# Patient Record
Sex: Female | Born: 1955
Health system: Southern US, Community
[De-identification: ages and names within clinical notes are randomized; demographics above are authoritative.]

## PROBLEM LIST (undated history)

## (undated) DIAGNOSIS — E43 Unspecified severe protein-calorie malnutrition: Secondary | ICD-10-CM

## (undated) DIAGNOSIS — I639 Cerebral infarction, unspecified: Secondary | ICD-10-CM

## (undated) DIAGNOSIS — I4892 Unspecified atrial flutter: Secondary | ICD-10-CM

## (undated) DIAGNOSIS — Z8541 Personal history of malignant neoplasm of cervix uteri: Secondary | ICD-10-CM

## (undated) DIAGNOSIS — M1990 Unspecified osteoarthritis, unspecified site: Secondary | ICD-10-CM

## (undated) DIAGNOSIS — H814 Vertigo of central origin: Secondary | ICD-10-CM

## (undated) DIAGNOSIS — I160 Hypertensive urgency: Secondary | ICD-10-CM

## (undated) DIAGNOSIS — J449 Chronic obstructive pulmonary disease, unspecified: Secondary | ICD-10-CM

## (undated) DIAGNOSIS — F17200 Nicotine dependence, unspecified, uncomplicated: Secondary | ICD-10-CM

## (undated) DIAGNOSIS — I4891 Unspecified atrial fibrillation: Secondary | ICD-10-CM

## (undated) DIAGNOSIS — I10 Essential (primary) hypertension: Secondary | ICD-10-CM

## (undated) DIAGNOSIS — K219 Gastro-esophageal reflux disease without esophagitis: Secondary | ICD-10-CM

## (undated) DIAGNOSIS — M4156 Other secondary scoliosis, lumbar region: Secondary | ICD-10-CM

## (undated) DIAGNOSIS — Z720 Tobacco use: Secondary | ICD-10-CM

## (undated) DIAGNOSIS — F419 Anxiety disorder, unspecified: Secondary | ICD-10-CM

## (undated) DIAGNOSIS — M5412 Radiculopathy, cervical region: Secondary | ICD-10-CM

## (undated) DIAGNOSIS — R42 Dizziness and giddiness: Secondary | ICD-10-CM

## (undated) DIAGNOSIS — U071 COVID-19: Secondary | ICD-10-CM

## (undated) HISTORY — DX: Personal history of malignant neoplasm of cervix uteri: Z85.41

## (undated) HISTORY — DX: Dizziness and giddiness: R42

## (undated) HISTORY — DX: Radiculopathy, cervical region: M54.12

## (undated) HISTORY — DX: COVID-19: U07.1

## (undated) HISTORY — DX: Unspecified atrial flutter: I48.92

## (undated) HISTORY — DX: Unspecified severe protein-calorie malnutrition: E43

## (undated) HISTORY — DX: Anxiety disorder, unspecified: F41.9

## (undated) HISTORY — DX: Nicotine dependence, unspecified, uncomplicated: F17.200

## (undated) HISTORY — DX: Essential (primary) hypertension: I10

## (undated) HISTORY — DX: Gastro-esophageal reflux disease without esophagitis: K21.9

## (undated) HISTORY — DX: Chronic obstructive pulmonary disease, unspecified: J44.9

## (undated) HISTORY — DX: Hypertensive urgency: I16.0

## (undated) HISTORY — DX: Tobacco use: Z72.0

## (undated) HISTORY — DX: Cerebral infarction, unspecified: I63.9

## (undated) HISTORY — DX: Vertigo of central origin: H81.4

## (undated) HISTORY — DX: Unspecified atrial fibrillation: I48.91

## (undated) HISTORY — PX: TUBAL LIGATION: SHX77

## (undated) HISTORY — DX: Other secondary scoliosis, lumbar region: M41.56

## (undated) HISTORY — DX: Unspecified osteoarthritis, unspecified site: M19.90

---

## 2016-09-01 DIAGNOSIS — I10 Essential (primary) hypertension: Secondary | ICD-10-CM | POA: Diagnosis not present

## 2016-09-01 DIAGNOSIS — Z23 Encounter for immunization: Secondary | ICD-10-CM | POA: Diagnosis not present

## 2016-11-05 DIAGNOSIS — J209 Acute bronchitis, unspecified: Secondary | ICD-10-CM | POA: Diagnosis not present

## 2016-11-05 DIAGNOSIS — R05 Cough: Secondary | ICD-10-CM | POA: Diagnosis not present

## 2017-05-02 DIAGNOSIS — R0789 Other chest pain: Secondary | ICD-10-CM | POA: Diagnosis not present

## 2017-05-02 DIAGNOSIS — Z130 Encounter for screening for diseases of the blood and blood-forming organs and certain disorders involving the immune mechanism: Secondary | ICD-10-CM | POA: Diagnosis not present

## 2017-05-02 DIAGNOSIS — M25511 Pain in right shoulder: Secondary | ICD-10-CM | POA: Diagnosis not present

## 2017-05-02 DIAGNOSIS — Z719 Counseling, unspecified: Secondary | ICD-10-CM | POA: Diagnosis not present

## 2017-05-02 DIAGNOSIS — Z716 Tobacco abuse counseling: Secondary | ICD-10-CM | POA: Diagnosis not present

## 2017-05-02 DIAGNOSIS — R5383 Other fatigue: Secondary | ICD-10-CM | POA: Diagnosis not present

## 2017-05-02 DIAGNOSIS — I10 Essential (primary) hypertension: Secondary | ICD-10-CM | POA: Diagnosis not present

## 2017-05-04 DIAGNOSIS — M5136 Other intervertebral disc degeneration, lumbar region: Secondary | ICD-10-CM | POA: Diagnosis not present

## 2017-05-04 DIAGNOSIS — M546 Pain in thoracic spine: Secondary | ICD-10-CM | POA: Diagnosis not present

## 2017-05-10 DIAGNOSIS — M5412 Radiculopathy, cervical region: Secondary | ICD-10-CM | POA: Diagnosis not present

## 2017-05-10 DIAGNOSIS — M4156 Other secondary scoliosis, lumbar region: Secondary | ICD-10-CM | POA: Diagnosis not present

## 2017-07-25 DIAGNOSIS — Z1231 Encounter for screening mammogram for malignant neoplasm of breast: Secondary | ICD-10-CM | POA: Diagnosis not present

## 2017-08-30 DIAGNOSIS — Z01419 Encounter for gynecological examination (general) (routine) without abnormal findings: Secondary | ICD-10-CM | POA: Diagnosis not present

## 2017-08-30 DIAGNOSIS — Z23 Encounter for immunization: Secondary | ICD-10-CM | POA: Diagnosis not present

## 2017-08-30 DIAGNOSIS — R87615 Unsatisfactory cytologic smear of cervix: Secondary | ICD-10-CM | POA: Diagnosis not present

## 2017-08-30 DIAGNOSIS — H6123 Impacted cerumen, bilateral: Secondary | ICD-10-CM | POA: Diagnosis not present

## 2017-08-30 DIAGNOSIS — Z0001 Encounter for general adult medical examination with abnormal findings: Secondary | ICD-10-CM | POA: Diagnosis not present

## 2017-09-28 ENCOUNTER — Other Ambulatory Visit: Payer: Self-pay

## 2017-09-28 DIAGNOSIS — R0989 Other specified symptoms and signs involving the circulatory and respiratory systems: Secondary | ICD-10-CM

## 2017-09-29 ENCOUNTER — Encounter: Payer: Self-pay | Admitting: Surgery

## 2017-11-14 ENCOUNTER — Encounter (HOSPITAL_COMMUNITY): Payer: Self-pay

## 2017-11-14 ENCOUNTER — Encounter: Payer: Self-pay | Admitting: Surgery

## 2017-12-26 ENCOUNTER — Ambulatory Visit (HOSPITAL_COMMUNITY)
Admission: RE | Admit: 2017-12-26 | Discharge: 2017-12-26 | Disposition: A | Discharge status: Home / Self Care | Payer: BLUE CROSS/BLUE SHIELD | Source: Ambulatory Visit | Attending: Surgery | Admitting: Surgery

## 2017-12-26 ENCOUNTER — Encounter: Payer: Self-pay | Admitting: Surgery

## 2017-12-26 ENCOUNTER — Other Ambulatory Visit: Payer: Self-pay

## 2017-12-26 ENCOUNTER — Ambulatory Visit (INDEPENDENT_AMBULATORY_CARE_PROVIDER_SITE_OTHER): Payer: BLUE CROSS/BLUE SHIELD | Admitting: Surgery

## 2017-12-26 VITALS — BP 183/91 | HR 60 | Temp 97.7°F | Resp 16 | Ht 69.0 in | Wt 125.0 lb

## 2017-12-26 DIAGNOSIS — I70213 Atherosclerosis of native arteries of extremities with intermittent claudication, bilateral legs: Secondary | ICD-10-CM | POA: Diagnosis not present

## 2017-12-26 DIAGNOSIS — R0989 Other specified symptoms and signs involving the circulatory and respiratory systems: Secondary | ICD-10-CM

## 2017-12-26 DIAGNOSIS — R9389 Abnormal findings on diagnostic imaging of other specified body structures: Secondary | ICD-10-CM | POA: Diagnosis not present

## 2017-12-26 NOTE — Progress Notes (Signed)
[facility] [place]  Patient name: Donna Waters MRN: [id number] DOB: 03-Dec-1955 Sex: female   REQUESTING PROVIDER:    Cletis Athens   REASON FOR CONSULT:    Abnormal color to legs  HISTORY OF PRESENT ILLNESS:   Donna Waters is a 62 y.o. female, who is referred today for evaluation of discoloration in her feet.  She states that her feet have been like this for a long time.  She also noted that her mother had similar issues.  She denies any symptoms of claudication.  She will occasionally get some tingling in her feet at night.  She does endorse sciatic back pain.  The patient is medically managed for hypertension.  She is a current smoker.  PAST MEDICAL HISTORY    Past Medical History:  Diagnosis Date  . Hypertension   . Nicotine dependence, unspecified, uncomplicated   . Unspecified osteoarthritis, unspecified site      FAMILY HISTORY   Family History  Problem Relation Age of Onset  . Arthritis Mother   . COPD Mother   . Depression Mother   . Diabetes Mother   . Heart disease Mother   . Hypertension Mother   . Hyperlipidemia Mother   . Thyroid disease Mother   . Arthritis Father   . Hypertension Father   . Stroke Father     SOCIAL HISTORY:   Social History   Socioeconomic History  . Marital status: Unknown    Spouse name: Not on file  . Number of children: Not on file  . Years of education: Not on file  . Highest education level: Not on file  Social Needs  . Financial resource strain: Not on file  . Food insecurity - worry: Not on file  . Food insecurity - inability: Not on file  . Transportation needs - medical: Not on file  . Transportation needs - non-medical: Not on file  Occupational History  . Not on file  Tobacco Use  . Smoking status: Current Every Day Smoker    Packs/day: 1.00    Types: Cigarettes  . Smokeless tobacco: Never Used  Substance and Sexual Activity  . Alcohol use: Yes     Comment: 6 pack weekly  . Drug use: Not on file  . Sexual activity: Not on file  Other Topics Concern  . Not on file  Social History Narrative  . Not on file    ALLERGIES:    No Known Allergies  CURRENT MEDICATIONS:    Current Outpatient Medications  Medication Sig Dispense Refill  . atenolol (TENORMIN) 100 MG tablet Take 100 mg by mouth daily.    . chlorthalidone (HYGROTON) 25 MG tablet Take 25 mg by mouth daily.    . carbamide peroxide (DEBROX) 6.5 % OTIC solution 5 drops as directed.    . meloxicam (MOBIC) 7.5 MG tablet     . methocarbamol (ROBAXIN) 750 MG tablet Take 750 mg by mouth every 8 (eight) hours as needed for muscle spasms.     No current facility-administered medications for this visit.     REVIEW OF SYSTEMS:   [X]  denotes positive finding, [ ]  denotes negative finding Cardiac  Comments:  Chest pain or chest pressure:    Shortness of breath upon exertion:    Short of breath when lying flat:    Irregular heart rhythm:        Vascular    Pain in calf, thigh, or hip brought on by ambulation:    Pain  in feet at night that wakes you up from your sleep:     Blood clot in your veins:    Leg swelling:         Pulmonary    Oxygen at home:    Productive cough:     Wheezing:         Neurologic    Sudden weakness in arms or legs:     Sudden numbness in arms or legs:     Sudden onset of difficulty speaking or slurred speech:    Temporary loss of vision in one eye:     Problems with dizziness:         Gastrointestinal    Blood in stool:      Vomited blood:         Genitourinary    Burning when urinating:     Blood in urine:        Psychiatric    Major depression:         Hematologic    Bleeding problems:    Problems with blood clotting too easily:        Skin    Rashes or ulcers:        Constitutional    Fever or chills:     PHYSICAL EXAM:   Vitals:   12/26/17 0949 12/26/17 0955  BP: (!) 189/95 (!) 183/91  Pulse: 60 60  Resp: 16    Temp: 97.7 F (36.5 C)   TempSrc: Oral   SpO2: 100%   Weight: 125 lb (56.7 kg)   Height: 5\' 9"  (1.753 m)     GENERAL: The patient is a well-nourished female, in no acute distress. The vital signs are documented above. CARDIAC: There is a regular rate and rhythm.  VASCULAR: Palpable dorsalis pedis and posterior tibial pulses bilaterally.  No carotid bruits. PULMONARY: Nonlabored respirations ABDOMEN: Soft and non-tender.  Aorta feels non-aneurysmal MUSCULOSKELETAL: There are no major deformities or cyanosis. NEUROLOGIC: No focal weakness or paresthesias are detected. SKIN: There are no ulcers or rashes noted. PSYCHIATRIC: The patient has a normal affect.  STUDIES:   ABIs were ordered today.  This shows 0.99 on the right and 1.01 on the left.  Both arteries are with triphasic waveforms.  Toe pressure on the left is 92 and 153 on the right  ASSESSMENT and PLAN   The patient has a normal vascular exam and ultrasound study today.  I do not feel that the discoloration in her feet is related to arterial insufficiency.   Durene CalWells Brabham, MD [facility] Tel [phone] Pager [phone]

## 2017-12-27 ENCOUNTER — Encounter: Payer: Self-pay | Admitting: Nurse Practitioner

## 2018-01-11 DIAGNOSIS — I998 Other disorder of circulatory system: Secondary | ICD-10-CM | POA: Diagnosis not present

## 2018-01-11 DIAGNOSIS — R9431 Abnormal electrocardiogram [ECG] [EKG]: Secondary | ICD-10-CM | POA: Diagnosis not present

## 2018-01-11 DIAGNOSIS — I10 Essential (primary) hypertension: Secondary | ICD-10-CM | POA: Diagnosis not present

## 2018-01-11 DIAGNOSIS — Z1329 Encounter for screening for other suspected endocrine disorder: Secondary | ICD-10-CM | POA: Diagnosis not present

## 2018-01-12 DIAGNOSIS — I998 Other disorder of circulatory system: Secondary | ICD-10-CM | POA: Diagnosis not present

## 2018-01-12 DIAGNOSIS — Z1329 Encounter for screening for other suspected endocrine disorder: Secondary | ICD-10-CM | POA: Diagnosis not present

## 2018-01-12 DIAGNOSIS — I10 Essential (primary) hypertension: Secondary | ICD-10-CM | POA: Diagnosis not present

## 2018-01-12 DIAGNOSIS — R9431 Abnormal electrocardiogram [ECG] [EKG]: Secondary | ICD-10-CM | POA: Diagnosis not present

## 2018-01-15 DIAGNOSIS — F17200 Nicotine dependence, unspecified, uncomplicated: Secondary | ICD-10-CM

## 2018-01-15 DIAGNOSIS — R9431 Abnormal electrocardiogram [ECG] [EKG]: Secondary | ICD-10-CM

## 2018-01-15 DIAGNOSIS — I10 Essential (primary) hypertension: Secondary | ICD-10-CM

## 2018-01-15 HISTORY — DX: Nicotine dependence, unspecified, uncomplicated: F17.200

## 2018-01-15 HISTORY — DX: Essential (primary) hypertension: I10

## 2018-01-15 HISTORY — DX: Abnormal electrocardiogram (ECG) (EKG): R94.31

## 2018-01-15 NOTE — Progress Notes (Signed)
Cardiology Office Note:    Date:  01/16/2018   ID:  Donna Waters, DOB 1956/03/15, MRN [id number]  PCP:  Retia Passe, NP  Cardiologist:  Norman Herrlich, MD   Referring MD: Marcellus Scott, MD  ASSESSMENT:    1. Abnormal EKG   2. Essential hypertension   3. Current smoker    PLAN:    In order of problems listed above:  1. Poorly controlled I will switch her from a low intensity beta-blocker to an ace diuretic combination asked her to sodium restrict check blood pressure right upper extremity daily and she wants to follow-up with her PCP with a goal blood pressure of less than 130 systolic EKG changes of atrial enlargement.  She has a minimally elevated catecholamine at this time I would not pursue the diagnosis of pheochromocytoma unless she was having ongoing poorly controlled tension.  She should have a follow-up BMP performed PCP office these are changes secondary to hypertension.  All BPs in the future right upper extremity 2. Poorly controlled switch to diuretic ACE combination 3. Encouraged regarding smoking cessation  Next appointment she will follow-up with her PCP in 1-2 weeks   Medication Adjustments/Labs and Tests Ordered: Current medicines are reviewed at length with the patient today.  Concerns regarding medicines are outlined above.  No orders of the defined types were placed in this encounter.  No orders of the defined types were placed in this encounter.    Chief Complaint  Patient presents with  . Hypertension  . Abnormal ECG    History of Present Illness:    Donna Waters is a 62 y.o. female who is being seen today for the evaluation of an abnormal EKG at the request of Donna Athens NP EKG 12/27/17 showed SRTH tall peaked t waves and poor R wave progression.  Today's EKG standardized shows findings of left and right atrial enlargement hypertensive heart disease.  She is a smoker no chest pain shortness of breath palpitation or syncope.  Been seen by vascular  surgery for discoloration of her feet and takes a beta-blocker that can cause peripheral vasoconstriction.  She relates that her blood pressure is been variable but on 2 occasions recently has been severely elevated in the range of 180-190 mmHg.  She does not snore but she had salt to her diet.  Recent labs show normal CBC and CMP.  Past Medical History:  Diagnosis Date  . Hypertension   . Nicotine dependence, unspecified, uncomplicated   . Unspecified osteoarthritis, unspecified site     History reviewed. No pertinent surgical history.  Current Medications: Current Meds  Medication Sig  . atenolol (TENORMIN) 100 MG tablet Take 100 mg by mouth daily.  . carbamide peroxide (DEBROX) 6.5 % OTIC solution 5 drops as directed.  . chlorthalidone (HYGROTON) 25 MG tablet Take 25 mg by mouth daily.  . meloxicam (MOBIC) 7.5 MG tablet      Allergies:   Patient has no known allergies.   Social History   Socioeconomic History  . Marital status: Unknown    Spouse name: None  . Number of children: None  . Years of education: None  . Highest education level: None  Social Needs  . Financial resource strain: None  . Food insecurity - worry: None  . Food insecurity - inability: None  . Transportation needs - medical: None  . Transportation needs - non-medical: None  Occupational History  . None  Tobacco Use  . Smoking status: Current Every Day Smoker  Packs/day: 1.00    Types: Cigarettes  . Smokeless tobacco: Never Used  Substance and Sexual Activity  . Alcohol use: Yes    Comment: 6 pack weekly  . Drug use: None  . Sexual activity: None  Other Topics Concern  . None  Social History Narrative  . None     Family History: The patient's family history includes Arthritis in her father and mother; COPD in her mother; Depression in her mother; Diabetes in her mother; Heart disease in her mother; Hyperlipidemia in her mother; Hypertension in her father and mother; Stroke in her father;  Thyroid disease in her mother.  ROS:   ROS Please see the history of present illness.     All other systems reviewed and are negative.  EKGs/Labs/Other Studies Reviewed:    The following studies were reviewed today:   EKG:  EKG is  ordered today.  The ekg ordered today demonstrates SRTH LAE RAE EKG 01/11/18 SRTH RAE ? Double voltage setting Recent Labs: Recent CBC and CMP are normal No results found for requested labs within last 8760 hours.  Recent Lipid Panel No results found for: CHOL, TRIG, HDL, CHOLHDL, VLDL, LDLCALC, LDLDIRECT  Physical Exam:    VS:  BP 132/76 (BP Location: Left Arm, Patient Position: Sitting, Cuff Size: Normal)   Pulse 69   Ht 5\' 9"  (1.753 m)   Wt 128 lb (58.1 kg)   SpO2 93%   BMI 18.90 kg/m     Wt Readings from Last 3 Encounters:  01/16/18 128 lb (58.1 kg)  12/26/17 125 lb (56.7 kg)    BP by me 140/60 right upper extremity 110/60 left upper extremity  GEN: She looks older than her age well nourished, well developed in no acute distress HEENT: Normal NECK: No JVD; No carotid bruits LYMPHATICS: No lymphadenopathy CARDIAC: RRR, no murmurs, rubs, gallops RESPIRATORY:  Clear to auscultation without rales, wheezing or rhonchi  ABDOMEN: Soft, non-tender, non-distended MUSCULOSKELETAL:  No edema; No deformity  SKIN: Warm and dry NEUROLOGIC:  Alert and oriented x 3 PSYCHIATRIC:  Normal affect     Signed, Norman HerrlichBrian Ravis Herne, MD  01/16/2018 4:40 PM    [facility]

## 2018-01-16 ENCOUNTER — Encounter: Payer: Self-pay | Admitting: Cardiology

## 2018-01-16 ENCOUNTER — Ambulatory Visit (INDEPENDENT_AMBULATORY_CARE_PROVIDER_SITE_OTHER): Payer: BLUE CROSS/BLUE SHIELD | Admitting: Cardiology

## 2018-01-16 VITALS — BP 132/76 | HR 69 | Ht 69.0 in | Wt 128.0 lb

## 2018-01-16 DIAGNOSIS — F17200 Nicotine dependence, unspecified, uncomplicated: Secondary | ICD-10-CM

## 2018-01-16 DIAGNOSIS — I10 Essential (primary) hypertension: Secondary | ICD-10-CM | POA: Diagnosis not present

## 2018-01-16 DIAGNOSIS — R9431 Abnormal electrocardiogram [ECG] [EKG]: Secondary | ICD-10-CM

## 2018-01-16 MED ORDER — LISINOPRIL-HYDROCHLOROTHIAZIDE 10-12.5 MG PO TABS: 1.0000 | ORAL_TABLET | Freq: Every day | ORAL | 0 refills | Status: DC

## 2018-01-16 NOTE — Patient Instructions (Addendum)
Medication Instructions:  Your physician has recommended you make the following change in your medication:  STOP atenolol STOP chlorthalidone  START lisinopril-hydrochlorothiazide 10mg -12.5 mg daily  Labwork: None  Testing/Procedures: You had an EKG today.  Follow-Up: Your physician recommends that you schedule a follow-up appointment as needed if symptoms worsen or fail to improve.  Follow up with PCP in 2-3 weeks.  Any Other Special Instructions Will Be Listed Below (If Applicable).     If you need a refill on your cardiac medications before your next appointment, please call your pharmacy.   DASH diet: Healthy eating to lower your blood pressure The DASH diet emphasizes portion size, eating a variety of foods and getting the right amount of nutrients. Discover how DASH can improve your health and lower your blood pressure. By [facility] Staff  DASH stands for Dietary Approaches to Stop Hypertension. The DASH diet is a lifelong approach to healthy eating that's designed to help treat or prevent high blood pressure (hypertension). The DASH diet encourages you to reduce the sodium in your diet and eat a variety of foods rich in nutrients that help lower blood pressure, such as potassium, calcium and magnesium. By following the DASH diet, you may be able to reduce your blood pressure by a few points in just two weeks. Over time, your systolic blood pressure could drop by eight to 14 points, which can make a significant difference in your health risks. Because the DASH diet is a healthy way of eating, it offers health benefits besides just lowering blood pressure. The DASH diet is also in line with dietary recommendations to prevent osteoporosis, cancer, heart disease, stroke and diabetes. DASH diet: Sodium levels The DASH diet emphasizes vegetables, fruits and low-fat dairy foods - and moderate amounts of whole grains, fish, poultry and nuts. In addition to the standard DASH diet,  there is also a lower sodium version of the diet. You can choose the version of the diet that meets your health needs: Standard DASH diet. You can consume up to 2,300 milligrams (mg) of sodium a day.  Lower sodium DASH diet. You can consume up to 1,500 mg of sodium a day. Both versions of the DASH diet aim to reduce the amount of sodium in your diet compared with what you might get in a typical American diet, which can amount to a whopping 3,400 mg of sodium a day or more. The standard DASH diet meets the recommendation from the Dietary Guidelines for Americans to keep daily sodium intake to less than 2,300 mg a day. The American Heart Association recommends 1,500 mg a day of sodium as an upper limit for all adults. If you aren't sure what sodium level is right for you, talk to your doctor. DASH diet: What to eat Both versions of the DASH diet include lots of whole grains, fruits, vegetables and low-fat dairy products. The DASH diet also includes some fish, poultry and legumes, and encourages a small amount of nuts and seeds a few times a week.  You can eat red meat, sweets and fats in small amounts. The DASH diet is low in saturated fat, cholesterol and total fat. Here's a look at the recommended servings from each food group for the 2,000-calorie-a-day DASH diet. Grains: 6 to 8 servings a day Grains include bread, cereal, rice and pasta. Examples of one serving of grains include 1 slice whole-wheat bread, 1 ounce dry cereal, or 1/2 cup cooked cereal, rice or pasta. Focus on whole grains because  they have more fiber and nutrients than do refined grains. For instance, use brown rice instead of white rice, whole-wheat pasta instead of regular pasta and whole-grain bread instead of white bread. Look for products labeled "100 percent whole grain" or "100 percent whole wheat."  Grains are naturally low in fat. Keep them this way by avoiding butter, cream and cheese sauces. Vegetables: 4 to 5 servings a  day Tomatoes, carrots, broccoli, sweet potatoes, greens and other vegetables are full of fiber, vitamins, and such minerals as potassium and magnesium. Examples of one serving include 1 cup raw leafy green vegetables or 1/2 cup cut-up raw or cooked vegetables. Don't think of vegetables only as side dishes - a hearty blend of vegetables served over brown rice or whole-wheat noodles can serve as the main dish for a meal.  Fresh and frozen vegetables are both good choices. When buying frozen and canned vegetables, choose those labeled as low sodium or without added salt.  To increase the number of servings you fit in daily, be creative. In a stir-fry, for instance, cut the amount of meat in half and double up on the vegetables. Fruits: 4 to 5 servings a day Many fruits need little preparation to become a healthy part of a meal or snack. Like vegetables, they're packed with fiber, potassium and magnesium and are typically low in fat - coconuts are an exception. Examples of one serving include one medium fruit, 1/2 cup fresh, frozen or canned fruit, or 4 ounces of juice. Have a piece of fruit with meals and one as a snack, then round out your day with a dessert of fresh fruits topped with a dollop of low-fat yogurt.  Leave on edible peels whenever possible. The peels of apples, pears and most fruits with pits add interesting texture to recipes and contain healthy nutrients and fiber.  Remember that citrus fruits and juices, such as grapefruit, can interact with certain medications, so check with your doctor or pharmacist to see if they're OK for you.  If you choose canned fruit or juice, make sure no sugar is added. Dairy: 2 to 3 servings a day Milk, yogurt, cheese and other dairy products are major sources of calcium, vitamin D and protein. But the key is to make sure that you choose dairy products that are low fat or fat-free because otherwise they can be a major source of fat - and most of it is saturated.  Examples of one serving include 1 cup skim or 1 percent milk, 1 cup low fat yogurt, or 1 1/2 ounces part-skim cheese. Low-fat or fat-free frozen yogurt can help you boost the amount of dairy products you eat while offering a sweet treat. Add fruit for a healthy twist.  If you have trouble digesting dairy products, choose lactose-free products or consider taking an over-the-counter product that contains the enzyme lactase, which can reduce or prevent the symptoms of lactose intolerance.  Go easy on regular and even fat-free cheeses because they are typically high in sodium. Lean meat, poultry and fish: 6 servings or fewer a day Meat can be a rich source of protein, B vitamins, iron and zinc. Choose lean varieties and aim for no more than 6 ounces a day. Cutting back on your meat portion will allow room for more vegetables. Trim away skin and fat from poultry and meat and then bake, broil, grill or roast instead of frying in fat.  Eat heart-healthy fish, such as salmon, herring and tuna. These types of  fish are high in omega-3 fatty acids, which can help lower your total cholesterol. Nuts, seeds and legumes: 4 to 5 servings a week Almonds, sunflower seeds, kidney beans, peas, lentils and other foods in this family are good sources of magnesium, potassium and protein. They're also full of fiber and phytochemicals, which are plant compounds that may protect against some cancers and cardiovascular disease. Serving sizes are small and are intended to be consumed only a few times a week because these foods are high in calories. Examples of one serving include 1/3 cup nuts, 2 tablespoons seeds, or 1/2 cup cooked beans or peas.  Nuts sometimes get a bad rap because of their fat content, but they contain healthy types of fat - monounsaturated fat and omega-3 fatty acids. They're high in calories, however, so eat them in moderation. Try adding them to stir-fries, salads or cereals.  Soybean-based products, such as  tofu and tempeh, can be a good alternative to meat because they contain all of the amino acids your body needs to make a complete protein, just like meat. Fats and oils: 2 to 3 servings a day Fat helps your body absorb essential vitamins and helps your body's immune system. But too much fat increases your risk of heart disease, diabetes and obesity. The DASH diet strives for a healthy balance by limiting total fat to less than 30 percent of daily calories from fat, with a focus on the healthier monounsaturated fats. Examples of one serving include 1 teaspoon soft margarine, 1 tablespoon mayonnaise or 2 tablespoons salad dressing. Saturated fat and trans fat are the main dietary culprits in increasing your risk of coronary artery disease. DASH helps keep your daily saturated fat to less than 6 percent of your total calories by limiting use of meat, butter, cheese, whole milk, cream and eggs in your diet, along with foods made from lard, solid shortenings, and palm and coconut oils.  Avoid trans fat, commonly found in such processed foods as crackers, baked goods and fried items.  Read food labels on margarine and salad dressing so that you can choose those that are lowest in saturated fat and free of trans fat. Sweets: 5 servings or fewer a week You don't have to banish sweets entirely while following the DASH diet - just go easy on them. Examples of one serving include 1 tablespoon sugar, jelly or jam, 1/2 cup sorbet, or 1 cup lemonade. When you eat sweets, choose those that are fat-free or low-fat, such as sorbets, fruit ices, jelly beans, hard candy, graham crackers or low-fat cookies.  Artificial sweeteners such as aspartame (NutraSweet, Equal) and sucralose (Splenda) may help satisfy your sweet tooth while sparing the sugar. But remember that you still must use them sensibly. It's OK to swap a diet cola for a regular cola, but not in place of a more nutritious beverage such as low-fat milk or even  plain water.  Cut back on added sugar, which has no nutritional value but can pack on calories. DASH diet: Alcohol and caffeine Drinking too much alcohol can increase blood pressure. The Dietary Guidelines for Americans recommends that men limit alcohol to no more than two drinks a day and women to one or less. The DASH diet doesn't address caffeine consumption. The influence of caffeine on blood pressure remains unclear. But caffeine can cause your blood pressure to rise at least temporarily. If you already have high blood pressure or if you think caffeine is affecting your blood pressure, talk to your  doctor about your caffeine consumption. DASH diet and weight loss While the DASH diet is not a weight-loss program, you may indeed lose unwanted pounds because it can help guide you toward healthier food choices. The DASH diet generally includes about 2,000 calories a day. If you're trying to lose weight, you may need to eat fewer calories. You may also need to adjust your serving goals based on your individual circumstances - something your health care team can help you decide. Tips to cut back on sodium The foods at the core of the DASH diet are naturally low in sodium. So just by following the DASH diet, you're likely to reduce your sodium intake. You also reduce sodium further by: Using sodium-free spices or flavorings with your food instead of salt  Not adding salt when cooking rice, pasta or hot cereal  Rinsing canned foods to remove some of the sodium  Buying foods labeled "no salt added," "sodium-free," "low sodium" or "very low sodium" One teaspoon of table salt has 2,325 mg of sodium. When you read food labels, you may be surprised at just how much sodium some processed foods contain. Even low-fat soups, canned vegetables, ready-to-eat cereals and sliced [place] from the local deli - foods you may have considered healthy - often have lots of sodium. You may notice a difference in taste when  you choose low-sodium food and beverages. If things seem too bland, gradually introduce low-sodium foods and cut back on table salt until you reach your sodium goal. That'll give your palate time to adjust. Using salt-free seasoning blends or herbs and spices may also ease the transition. It can take several weeks for your taste buds to get used to less salty foods. Putting the pieces of the DASH diet together Try these strategies to get started on the DASH diet:  Change gradually. If you now eat only one or two servings of fruits or vegetables a day, try to add a serving at lunch and one at dinner. Rather than switching to all whole grains, start by making one or two of your grain servings whole grains. Increasing fruits, vegetables and whole grains gradually can also help prevent bloating or diarrhea that may occur if you aren't used to eating a diet with lots of fiber. You can also try over-the-counter products to help reduce gas from beans and vegetables.  Reward successes and forgive slip-ups. Reward yourself with a nonfood treat for your accomplishments - rent a movie, purchase a book or get together with a friend. Everyone slips, especially when learning something new. Remember that changing your lifestyle is a long-term process. Find out what triggered your setback and then just pick up where you left off with the DASH diet.  Add physical activity. To boost your blood pressure lowering efforts even more, consider increasing your physical activity in addition to following the DASH diet. Combining both the DASH diet and physical activity makes it more likely that you'll reduce your blood pressure.  Get support if you need it. If you're having trouble sticking to your diet, talk to your doctor or dietitian about it. You might get some tips that will help you stick to the DASH diet. Remember, healthy eating isn't an all-or-nothing proposition. What's most important is that, on average, you eat healthier  foods with plenty of variety - both to keep your diet nutritious and to avoid boredom or extremes. And with the DASH diet, you can have both.

## 2018-01-18 ENCOUNTER — Telehealth: Payer: Self-pay | Admitting: Cardiology

## 2018-01-18 NOTE — Telephone Encounter (Signed)
Her BP is 93/68 and her heartrate is 104-her head feels full and she feels sick on her stomach

## 2018-01-18 NOTE — Telephone Encounter (Signed)
Patient states that she took the new medication lisinopril-HCTZ 10mg -12.5 mg yesterday morning and felt fine. This morning she has just felt tired, had a stomach ache, and a headache. Patient left work early and went to [facility] to check her blood pressure. BP 114/73, HR 103. Patient waited for a few minutes then rechecked, BP: 93/68, HR 104. Patient contacted her PCP who advised her to call Dr. Dulce SellarMunley as Dr. Dulce SellarMunley is the one who prescribed the new medication to see if he wanted to make any adjustments. Patient is not experiencing any chest pain or shortness of breath.  Advised patient that blood pressure monitors in places like [facility] are not always accurate. Inquired if patient has a blood pressure monitor at home. Patient states that she does not, but she contacted her daughter who does and her daughter is bringing her blood pressure monitor to the patient when she gets off work.   Patient will hold off on taking dose tomorrow until she receives further instruction. Patient advised to go to the nearest emergency department if she begins to experience chest pain or shortness of breath, or feels worse.   Please advise on medication.

## 2018-01-18 NOTE — Telephone Encounter (Signed)
Reduce to one half daily

## 2018-01-19 ENCOUNTER — Other Ambulatory Visit: Payer: Self-pay

## 2018-01-19 MED ORDER — LISINOPRIL-HYDROCHLOROTHIAZIDE 10-12.5 MG PO TABS: 0.5000 | ORAL_TABLET | Freq: Every day | ORAL | 2 refills | Status: DC

## 2018-01-19 NOTE — Telephone Encounter (Signed)
Called patient to discuss medication changes; she stated that her BP is 117/73 and HR is 99. Should we consider another option for this patient?

## 2018-01-19 NOTE — Telephone Encounter (Signed)
Informed patient that Dr. Dulce SellarMunley wishes her to cut her lisinopril-HCTZ in half. Instructed patient to log her BP and pulse for 1 week and call this into the office. Patient was agreeable to the change and log. Instructed her to call the office with any other concerns or questions.

## 2018-01-23 ENCOUNTER — Ambulatory Visit (INDEPENDENT_AMBULATORY_CARE_PROVIDER_SITE_OTHER): Payer: BLUE CROSS/BLUE SHIELD | Admitting: Cardiology

## 2018-01-23 ENCOUNTER — Encounter: Payer: Self-pay | Admitting: Cardiology

## 2018-01-23 ENCOUNTER — Telehealth: Payer: Self-pay | Admitting: Cardiology

## 2018-01-23 VITALS — BP 132/84 | HR 95 | Ht 69.0 in | Wt 126.0 lb

## 2018-01-23 DIAGNOSIS — I10 Essential (primary) hypertension: Secondary | ICD-10-CM | POA: Diagnosis not present

## 2018-01-23 MED ORDER — VERAPAMIL HCL ER 120 MG PO TBCR: 120.0000 mg | EXTENDED_RELEASE_TABLET | Freq: Every day | ORAL | 5 refills | Status: DC

## 2018-01-23 NOTE — Telephone Encounter (Signed)
Patient advised to stop ARB. Patient requested appointment be made for today. Appointment scheduled for 3:40 pm, patient verbalized understanding, no further questions.

## 2018-01-23 NOTE — Telephone Encounter (Signed)
Has not been able to work since starting that BP medicine and she's really scared about losing her job

## 2018-01-23 NOTE — Telephone Encounter (Signed)
Patient reduced medication to 0.5 tablet daily, and the past two days reduced to 0.25 tablet daily. Patient went to make her bed this morning and felt like she couldn't breathe BP 109/71, pulse 122.   Saturday patient was busy doing things, pulse went up to 138. Patient states her pulse has never been like this that she knows of. Sunday BP 71/59, 2 hours later BP 108 on top. Saturday and Sunday the patient took 0.25 tablet. Pulse is always greater than 100 since starting this new medication.  Patient is very tearful on the phone. Please advise on options.

## 2018-01-23 NOTE — Telephone Encounter (Signed)
DC her ARB, needs office FU, lots of calls and not feeling well

## 2018-01-23 NOTE — Progress Notes (Signed)
Cardiology Office Note:    Date:  01/23/2018   ID:  Donna Waters, DOB 03/30/1956, MRN [id number]  PCP:  Retia Passeose, Melissa S, NP  Cardiologist:  Norman HerrlichBrian Tyjai Charbonnet, MD    Referring MD: Retia Passeose, Melissa S, NP    ASSESSMENT:    1. Essential hypertension    PLAN:    In order of problems listed above:  1. She has been poorly tolerant of ARB diuretic with symptomatic hypotension and tachycardia is discontinued on put on low-dose normal rate limiting calcium channel blocker and recheck renal function.  One thing it did improve though were her dusky toes that have resolved off of beta-blocker.   Next appointment: 4 weeks   Medication Adjustments/Labs and Tests Ordered: Current medicines are reviewed at length with the patient today.  Concerns regarding medicines are outlined above.  Orders Placed This Encounter  Procedures  . Basic Metabolic Panel (BMET)   Meds ordered this encounter  Medications  . verapamil (CALAN-SR) 120 MG CR tablet    Sig: Take 1 tablet (120 mg total) by mouth at bedtime. Do not start 01/25/18 Reduce to 1/2 if BP < 120    Dispense:  30 tablet    Refill:  5    Chief Complaint  Patient presents with  . Follow-up    to discuss BP issuess   . Hypertension    History of Present Illness:    Donna AlineConnie Knipp is a 62 y.o. female with a hx of abn abnormal EKG and hypertension last seen 1 week ago. Compliance with diet, lifestyle and medications: Yes There been multiple phone calls to the office about blood pressure being low heart rate being rapid he decrease the dose of her ARB diuretic finally stopped in the office today to be seen by me.  Today her vital signs are normal.  She does not tolerate ARB diuretic we will discontinue weight 48 hours and start a low-dose rate limiting calcium channel blocker.  Will recheck renal function today. Past Medical History:  Diagnosis Date  . Hypertension   . Nicotine dependence, unspecified, uncomplicated   . Unspecified osteoarthritis,  unspecified site     History reviewed. No pertinent surgical history.  Current Medications: Current Meds  Medication Sig  . meloxicam (MOBIC) 7.5 MG tablet Take 7.5 mg by mouth 2 (two) times daily as needed for pain.   . [DISCONTINUED] lisinopril-hydrochlorothiazide (PRINZIDE,ZESTORETIC) 10-12.5 MG tablet Take 0.5 tablets by mouth daily.     Allergies:   Patient has no known allergies.   Social History   Socioeconomic History  . Marital status: Unknown    Spouse name: Not on file  . Number of children: Not on file  . Years of education: Not on file  . Highest education level: Not on file  Occupational History  . Not on file  Social Needs  . Financial resource strain: Not on file  . Food insecurity:    Worry: Not on file    Inability: Not on file  . Transportation needs:    Medical: Not on file    Non-medical: Not on file  Tobacco Use  . Smoking status: Current Every Day Smoker    Packs/day: 1.00    Types: Cigarettes  . Smokeless tobacco: Never Used  Substance and Sexual Activity  . Alcohol use: Yes    Comment: 6 pack weekly  . Drug use: Not Currently  . Sexual activity: Not on file  Lifestyle  . Physical activity:    Days per week: Not  on file    Minutes per session: Not on file  . Stress: Not on file  Relationships  . Social connections:    Talks on phone: Not on file    Gets together: Not on file    Attends religious service: Not on file    Active member of club or organization: Not on file    Attends meetings of clubs or organizations: Not on file    Relationship status: Not on file  Other Topics Concern  . Not on file  Social History Narrative  . Not on file     Family History: The patient's family history includes Arthritis in her father and mother; COPD in her mother; Depression in her mother; Diabetes in her mother; Heart disease in her mother; Hyperlipidemia in her mother; Hypertension in her father and mother; Stroke in her father and sister;  Thyroid disease in her mother. ROS:   Please see the history of present illness.    All other systems reviewed and are negative.  EKGs/Labs/Other Studies Reviewed:    The following studies were reviewed today:  Recent Labs: No results found for requested labs within last 8760 hours.  Recent Lipid Panel No results found for: CHOL, TRIG, HDL, CHOLHDL, VLDL, LDLCALC, LDLDIRECT  Physical Exam:    VS:  BP 132/84 (BP Location: Right Arm, Patient Position: Sitting, Cuff Size: Normal)   Pulse 95   Ht 5\' 9"  (1.753 m)   Wt 126 lb (57.2 kg)   SpO2 98%   BMI 18.61 kg/m     Wt Readings from Last 3 Encounters:  01/23/18 126 lb (57.2 kg)  01/16/18 128 lb (58.1 kg)  12/26/17 125 lb (56.7 kg)     GEN:  Well nourished, well developed in no acute distress HEENT: Normal NECK: No JVD; No carotid bruits LYMPHATICS: No lymphadenopathy CARDIAC: RRR, no murmurs, rubs, gallops RESPIRATORY:  Clear to auscultation without rales, wheezing or rhonchi  ABDOMEN: Soft, non-tender, non-distended MUSCULOSKELETAL:  No edema; No deformity  SKIN: Warm and dry NEUROLOGIC:  Alert and oriented x 3 PSYCHIATRIC:  Normal affect    Signed, Norman Herrlich, MD  01/23/2018 4:10 PM    [facility]

## 2018-01-23 NOTE — Patient Instructions (Signed)
Medication Instructions:  Your physician has recommended you make the following change in your medication:   STOP: Lisinopril/HCTZ START: Verapamil   Labwork: Your physician recommends that you have lab work today BMP   Testing/Procedures: NONE   Follow-Up: Your physician recommends that you schedule a follow-up appointment in: 4 weeks    Any Other Special Instructions Will Be Listed Below (If Applicable).     If you need a refill on your cardiac medications before your next appointment, please call your pharmacy.

## 2018-01-24 ENCOUNTER — Other Ambulatory Visit: Payer: Self-pay

## 2018-01-24 DIAGNOSIS — I10 Essential (primary) hypertension: Secondary | ICD-10-CM

## 2018-01-24 LAB — BASIC METABOLIC PANEL
BUN / CREAT RATIO: 17 (ref 12–28)
BUN: 15 mg/dL (ref 8–27)
CALCIUM: 9.6 mg/dL (ref 8.7–10.3)
CHLORIDE: 100 mmol/L (ref 96–106)
CO2: 22 mmol/L (ref 20–29)
Creatinine, Ser: 0.9 mg/dL (ref 0.57–1.00)
GFR calc non Af Amer: 70 mL/min/{1.73_m2} (ref >59)
GFR, EST AFRICAN AMERICAN: 80 mL/min/{1.73_m2} (ref >59)
Glucose: 87 mg/dL (ref 65–99)
POTASSIUM: 5.3 mmol/L — AB (ref 3.5–5.2)
Sodium: 139 mmol/L (ref 134–144)

## 2018-01-24 MED ORDER — VERAPAMIL HCL ER 120 MG PO TBCR: 120.0000 mg | EXTENDED_RELEASE_TABLET | Freq: Every day | ORAL | 11 refills | Status: DC

## 2018-01-24 MED ORDER — VERAPAMIL HCL ER 120 MG PO TBCR
120.0000 mg | EXTENDED_RELEASE_TABLET | Freq: Every day | ORAL | 11 refills | Status: DC
Start: 2018-01-24 — End: 2018-02-27

## 2018-02-20 ENCOUNTER — Ambulatory Visit: Payer: BLUE CROSS/BLUE SHIELD | Admitting: Cardiology

## 2018-02-22 DIAGNOSIS — I10 Essential (primary) hypertension: Secondary | ICD-10-CM | POA: Diagnosis not present

## 2018-02-27 ENCOUNTER — Telehealth: Payer: Self-pay

## 2018-02-27 DIAGNOSIS — I10 Essential (primary) hypertension: Secondary | ICD-10-CM

## 2018-02-27 MED ORDER — VERAPAMIL HCL ER 180 MG PO TBCR: 180.0000 mg | EXTENDED_RELEASE_TABLET | Freq: Every day | ORAL | 0 refills | Status: DC

## 2018-02-27 NOTE — Telephone Encounter (Signed)
Left message to return call. Patient needs an appointment to discuss abnormal EKG.

## 2018-02-27 NOTE — Telephone Encounter (Signed)
-----   Message from Baldo Daub, MD sent at 02/27/2018  2:08 PM EDT ----- Very abnormal EKG needs to see me

## 2018-02-27 NOTE — Telephone Encounter (Signed)
Patient states she seen Cletis Athens, NP last week and was told the EKG was abnormal, but similar to last EKG. Verapamil dose was increase to 180 mg on Friday.   Patient is "racking up bills," and wants to know if the EKG is similar to the ones we have in the chart. If it is, she has a payment plan set up with Melissa Rose's office and would prefer to be seen there to keep from getting so many medical bills.   Please advise.

## 2018-02-27 NOTE — Telephone Encounter (Signed)
EKG is quite abnormal different from previous

## 2018-02-27 NOTE — Addendum Note (Signed)
Addended by: Sharene Butters on: 02/27/2018 03:33 PM   Modules accepted: Orders

## 2018-02-28 ENCOUNTER — Telehealth: Payer: Self-pay | Admitting: Cardiology

## 2018-02-28 NOTE — Telephone Encounter (Signed)
Left message to return call on mobile number.   

## 2018-02-28 NOTE — Telephone Encounter (Signed)
Advised patient of need to discuss abnormal EKG with Dr. Dulce Sellar. Per Dr. Dulce Sellar is very abnormal compared to last EKG. Patient became very tearful because her insurance does not cover much and she cannot afford repeat visits. Advised patient that she could apply for patient assistance with [facility]. Patient would like to talk to her primary care physician tomorrow and return call to let us know of her decision to schedule an appointment or not.

## 2018-02-28 NOTE — Telephone Encounter (Signed)
See previous telephone note. 

## 2018-02-28 NOTE — Telephone Encounter (Signed)
Patient is returning your call.  

## 2018-02-28 NOTE — Telephone Encounter (Signed)
Left message to return call on home number. Mobile number voicemail box is full.

## 2018-03-01 ENCOUNTER — Encounter: Payer: Self-pay | Admitting: Cardiology

## 2018-03-01 ENCOUNTER — Ambulatory Visit (INDEPENDENT_AMBULATORY_CARE_PROVIDER_SITE_OTHER): Payer: BLUE CROSS/BLUE SHIELD | Admitting: Cardiology

## 2018-03-01 ENCOUNTER — Ambulatory Visit (HOSPITAL_BASED_OUTPATIENT_CLINIC_OR_DEPARTMENT_OTHER)
Admission: RE | Admit: 2018-03-01 | Discharge: 2018-03-01 | Disposition: A | Discharge status: Home / Self Care | Payer: BLUE CROSS/BLUE SHIELD | Source: Ambulatory Visit | Attending: Cardiology | Admitting: Cardiology

## 2018-03-01 VITALS — BP 140/90 | HR 71 | Ht 69.0 in | Wt 128.8 lb

## 2018-03-01 DIAGNOSIS — I10 Essential (primary) hypertension: Secondary | ICD-10-CM | POA: Diagnosis not present

## 2018-03-01 DIAGNOSIS — J449 Chronic obstructive pulmonary disease, unspecified: Secondary | ICD-10-CM | POA: Diagnosis not present

## 2018-03-01 DIAGNOSIS — I998 Other disorder of circulatory system: Secondary | ICD-10-CM

## 2018-03-01 DIAGNOSIS — R9431 Abnormal electrocardiogram [ECG] [EKG]: Secondary | ICD-10-CM

## 2018-03-01 DIAGNOSIS — Z01810 Encounter for preprocedural cardiovascular examination: Secondary | ICD-10-CM

## 2018-03-01 HISTORY — DX: Other disorder of circulatory system: I99.8

## 2018-03-01 MED ORDER — SPIRONOLACTONE 25 MG PO TABS: 25.0000 mg | ORAL_TABLET | Freq: Every day | ORAL | 11 refills | Status: DC

## 2018-03-01 NOTE — H&P (View-Only) (Signed)
Cardiology Office Note:    Date:  03/01/2018   ID:  Donna Waters, DOB 21-Sep-1956, MRN [id number]  PCP:  Retia Passe, NP  Cardiologist:  Norman Herrlich, MD    Referring MD: Retia Passe, NP    ASSESSMENT:    1. Abnormal EKG   2. Preop cardiovascular exam   3. Ischemia   4. Essential hypertension    PLAN:    In order of problems listed above:  1. EKG shows dynamic ischemic T wave inversions she will refer for coronary angiography troponin checked today continue calcium channel blocker 2. No ischemic T wave inversion see plan above 3. Poorly controlled start Spironolactone   Next appointment: 6 weeks   Medication Adjustments/Labs and Tests Ordered: Current medicines are reviewed at length with the patient today.  Concerns regarding medicines are outlined above.  Orders Placed This Encounter  Procedures  . DG Chest 2 View  . CBC  . Basic metabolic panel  . Troponin I  . EKG 12-Lead   Meds ordered this encounter  Medications  . spironolactone (ALDACTONE) 25 MG tablet    Sig: Take 1 tablet (25 mg total) by mouth daily.    Dispense:  30 tablet    Refill:  11    Chief Complaint  Patient presents with  . Follow-up    History of Present Illness:    Donna Waters is a 62 y.o. female with a hx of a hx of abn abnormal EKG and hypertension  last seen 01/23/18. Arecent EKG was forwarded to me with new ischemic T wave inversion. Compliance with diet, lifestyle and medications: Yes Since her last visit her blood pressure is been elevated and is part of an outpatient evaluation and a repeat EKG recently previous EKG showed tall T waves and early repolarization and EKG with her PCP showed new marked ischemic changes.  It was sent to me I phoned the patient and asked her to come to the office.  Recently she has had no chest pain but tells me in 4 July she was seen in urgent care with a prolonged episode of severe chest discomfort repeat daily EKG in the office today is consistent  with cardiac injury or ischemia.  She will have a troponin drawn and I advised her to undergo coronary angiography.  In this situation I do not think that a stress test is adequate and her pretest probability of CAD with these dynamic EKG changes is quite high.  If troponin is abnormal she would be treated as acute coronary syndrome.  She has no dye allergy.  She may have renal artery stenosis as she developed marked hypotension when she took an ACE inhibitor in the past.  In general she does not feel well and is troubled by chronic cough she has a long history of cigarette smoking was intolerant of beta-blockers in the past will have a chest x-ray performed.  She has had no chest pain palpitation or syncope.  With systolic blood pressures up to 200 at home I will place her on a diuretic spironolactone 25 mg daily and continue her rate limiting calcium channel blocker.  She has no history of congenital rheumatic heart disease. Past Medical History:  Diagnosis Date  . Hypertension   . Nicotine dependence, unspecified, uncomplicated   . Unspecified osteoarthritis, unspecified site     History reviewed. No pertinent surgical history.  Current Medications: Current Meds  Medication Sig  . meloxicam (MOBIC) 7.5 MG tablet Take 7.5 mg by  mouth 2 (two) times daily as needed for pain.   . verapamil (CALAN-SR) 180 MG CR tablet Take 1 tablet (180 mg total) by mouth at bedtime. Do not start 01/25/18. Reduce to 1/2 if BP < 120     Allergies:   Patient has no known allergies.   Social History   Socioeconomic History  . Marital status: Unknown    Spouse name: Not on file  . Number of children: Not on file  . Years of education: Not on file  . Highest education level: Not on file  Occupational History  . Not on file  Social Needs  . Financial resource strain: Not on file  . Food insecurity:    Worry: Not on file    Inability: Not on file  . Transportation needs:    Medical: Not on file     Non-medical: Not on file  Tobacco Use  . Smoking status: Current Every Day Smoker    Packs/day: 1.00    Types: Cigarettes  . Smokeless tobacco: Never Used  Substance and Sexual Activity  . Alcohol use: Yes    Comment: 6 pack weekly  . Drug use: Not Currently  . Sexual activity: Not on file  Lifestyle  . Physical activity:    Days per week: Not on file    Minutes per session: Not on file  . Stress: Not on file  Relationships  . Social connections:    Talks on phone: Not on file    Gets together: Not on file    Attends religious service: Not on file    Active member of club or organization: Not on file    Attends meetings of clubs or organizations: Not on file    Relationship status: Not on file  Other Topics Concern  . Not on file  Social History Narrative  . Not on file     Family History: The patient's family history includes Arthritis in her father and mother; COPD in her mother; Depression in her mother; Diabetes in her mother; Heart disease in her mother; Hyperlipidemia in her mother; Hypertension in her father and mother; Stroke in her father and sister; Thyroid disease in her mother. ROS:   Please see the history of present illness.    All other systems reviewed and are negative.  EKGs/Labs/Other Studies Reviewed:    The following studies were reviewed today:  EKG:  EKG ordered today.  The ekg ordered today demonstrates continued ischemic T wave inversion diffusely and subtle ST elevation in V3 certainly not diagnostic of MI EKG 12/27/17 showed SRTH tall peaked t waves and poor R wave progression.   EKG standardized 01/16/18 showed findings of left and right atrial enlargement hypertensive heart disease.   Recent Labs: 01/23/2018: BUN 15; Creatinine, Ser 0.90; Potassium 5.3; Sodium 139  Recent Lipid Panel No results found for: CHOL, TRIG, HDL, CHOLHDL, VLDL, LDLCALC, LDLDIRECT  Physical Exam:    VS:  BP 140/90 (BP Location: Right Arm, Patient Position:  Sitting, Cuff Size: Normal)   Pulse 71   Ht  (1.753 m)   Wt 128 lb 12.8 oz (58.4 kg)   SpO2 97%   BMI 19.02 kg/m     Wt Readings from Last 3 Encounters:  03/01/18 128 lb 12.8 oz (58.4 kg)  01/23/18 126 lb (57.2 kg)  01/16/18 128 lb (58.1 kg)     GEN: She looks older than her age chronic cough in no acute distress HEENT: Normal NECK: No JVD; No carotid  bruits LYMPHATICS: No lymphadenopathy CARDIAC: Distant heart sounds RRR, no murmurs, rubs, gallops RESPIRATORY:  Clear to auscultation without rales, wheezing or rhonchi  ABDOMEN: Soft, non-tender, non-distended MUSCULOSKELETAL:  No edema; No deformity  SKIN: Warm and dry NEUROLOGIC:  Alert and oriented x 3 PSYCHIATRIC:  Normal affect    Signed, Norman Herrlich, MD  03/01/2018 3:20 PM    [facility]

## 2018-03-01 NOTE — Progress Notes (Signed)
Cardiology Office Note:    Date:  03/01/2018   ID:  Donna Waters, DOB 11/16/1955, MRN [id number]  PCP:  Rose, Melissa S, NP  Cardiologist:  Pauline Trainer, MD    Referring MD: Rose, Melissa S, NP    ASSESSMENT:    1. Abnormal EKG   2. Preop cardiovascular exam   3. Ischemia   4. Essential hypertension    PLAN:    In order of problems listed above:  1. EKG shows dynamic ischemic T wave inversions she will refer for coronary angiography troponin checked today continue calcium channel blocker 2. No ischemic T wave inversion see plan above 3. Poorly controlled start Spironolactone   Next appointment: 6 weeks   Medication Adjustments/Labs and Tests Ordered: Current medicines are reviewed at length with the patient today.  Concerns regarding medicines are outlined above.  Orders Placed This Encounter  Procedures  . DG Chest 2 View  . CBC  . Basic metabolic panel  . Troponin I  . EKG 12-Lead   Meds ordered this encounter  Medications  . spironolactone (ALDACTONE) 25 MG tablet    Sig: Take 1 tablet (25 mg total) by mouth daily.    Dispense:  30 tablet    Refill:  11    Chief Complaint  Patient presents with  . Follow-up    History of Present Illness:    Donna Waters is a 62 y.o. female with a hx of a hx of abn abnormal EKG and hypertension  last seen 01/23/18. Arecent EKG was forwarded to me with new ischemic T wave inversion. Compliance with diet, lifestyle and medications: Yes Since her last visit her blood pressure is been elevated and is part of an outpatient evaluation and a repeat EKG recently previous EKG showed tall T waves and early repolarization and EKG with her PCP showed new marked ischemic changes.  It was sent to me I phoned the patient and asked her to come to the office.  Recently she has had no chest pain but tells me in 4 July she was seen in urgent care with a prolonged episode of severe chest discomfort repeat daily EKG in the office today is consistent  with cardiac injury or ischemia.  She will have a troponin drawn and I advised her to undergo coronary angiography.  In this situation I do not think that a stress test is adequate and her pretest probability of CAD with these dynamic EKG changes is quite high.  If troponin is abnormal she would be treated as acute coronary syndrome.  She has no dye allergy.  She may have renal artery stenosis as she developed marked hypotension when she took an ACE inhibitor in the past.  In general she does not feel well and is troubled by chronic cough she has a long history of cigarette smoking was intolerant of beta-blockers in the past will have a chest x-ray performed.  She has had no chest pain palpitation or syncope.  With systolic blood pressures up to 200 at home I will place her on a diuretic spironolactone 25 mg daily and continue her rate limiting calcium channel blocker.  She has no history of congenital rheumatic heart disease. Past Medical History:  Diagnosis Date  . Hypertension   . Nicotine dependence, unspecified, uncomplicated   . Unspecified osteoarthritis, unspecified site     History reviewed. No pertinent surgical history.  Current Medications: Current Meds  Medication Sig  . meloxicam (MOBIC) 7.5 MG tablet Take 7.5 mg by   mouth 2 (two) times daily as needed for pain.   . verapamil (CALAN-SR) 180 MG CR tablet Take 1 tablet (180 mg total) by mouth at bedtime. Do not start 01/25/18. Reduce to 1/2 if BP < 120     Allergies:   Patient has no known allergies.   Social History   Socioeconomic History  . Marital status: Unknown    Spouse name: Not on file  . Number of children: Not on file  . Years of education: Not on file  . Highest education level: Not on file  Occupational History  . Not on file  Social Needs  . Financial resource strain: Not on file  . Food insecurity:    Worry: Not on file    Inability: Not on file  . Transportation needs:    Medical: Not on file     Non-medical: Not on file  Tobacco Use  . Smoking status: Current Every Day Smoker    Packs/day: 1.00    Types: Cigarettes  . Smokeless tobacco: Never Used  Substance and Sexual Activity  . Alcohol use: Yes    Comment: 6 pack weekly  . Drug use: Not Currently  . Sexual activity: Not on file  Lifestyle  . Physical activity:    Days per week: Not on file    Minutes per session: Not on file  . Stress: Not on file  Relationships  . Social connections:    Talks on phone: Not on file    Gets together: Not on file    Attends religious service: Not on file    Active member of club or organization: Not on file    Attends meetings of clubs or organizations: Not on file    Relationship status: Not on file  Other Topics Concern  . Not on file  Social History Narrative  . Not on file     Family History: The patient's family history includes Arthritis in her father and mother; COPD in her mother; Depression in her mother; Diabetes in her mother; Heart disease in her mother; Hyperlipidemia in her mother; Hypertension in her father and mother; Stroke in her father and sister; Thyroid disease in her mother. ROS:   Please see the history of present illness.    All other systems reviewed and are negative.  EKGs/Labs/Other Studies Reviewed:    The following studies were reviewed today:  EKG:  EKG ordered today.  The ekg ordered today demonstrates continued ischemic T wave inversion diffusely and subtle ST elevation in V3 certainly not diagnostic of MI EKG 12/27/17 showed SRTH tall peaked t waves and poor R wave progression.   EKG standardized 01/16/18 showed findings of left and right atrial enlargement hypertensive heart disease.   Recent Labs: 01/23/2018: BUN 15; Creatinine, Ser 0.90; Potassium 5.3; Sodium 139  Recent Lipid Panel No results found for: CHOL, TRIG, HDL, CHOLHDL, VLDL, LDLCALC, LDLDIRECT  Physical Exam:    VS:  BP 140/90 (BP Location: Right Arm, Patient Position:  Sitting, Cuff Size: Normal)   Pulse 71   Ht 5' 9" (1.753 m)   Wt 128 lb 12.8 oz (58.4 kg)   SpO2 97%   BMI 19.02 kg/m     Wt Readings from Last 3 Encounters:  03/01/18 128 lb 12.8 oz (58.4 kg)  01/23/18 126 lb (57.2 kg)  01/16/18 128 lb (58.1 kg)     GEN: She looks older than her age chronic cough in no acute distress HEENT: Normal NECK: No JVD; No carotid   bruits LYMPHATICS: No lymphadenopathy CARDIAC: Distant heart sounds RRR, no murmurs, rubs, gallops RESPIRATORY:  Clear to auscultation without rales, wheezing or rhonchi  ABDOMEN: Soft, non-tender, non-distended MUSCULOSKELETAL:  No edema; No deformity  SKIN: Warm and dry NEUROLOGIC:  Alert and oriented x 3 PSYCHIATRIC:  Normal affect    Signed, Lanisha Stepanian, MD  03/01/2018 3:20 PM    [facility]  

## 2018-03-01 NOTE — Patient Instructions (Addendum)
Medication Instructions:  Your physician has recommended you make the following change in your medication: START spironolactone 25 mg daily  Labwork: Your physician recommends that you have the following labs drawn: BMP, CBC, troponin  Testing/Procedures: You had an EKG today.  A chest x-ray takes a picture of the organs and structures inside the chest, including the heart, lungs, and blood vessels. This test can show several things, including, whether the heart is enlarges; whether fluid is building up in the lungs; and whether pacemaker / defibrillator leads are still in place.  Your physician has requested that you have a cardiac catheterization. Cardiac catheterization is used to diagnose and/or treat various heart conditions. Doctors may recommend this procedure for a number of different reasons. The most common reason is to evaluate chest pain. Chest pain can be a symptom of coronary artery disease (CAD), and cardiac catheterization can show whether plaque is narrowing or blocking your heart's arteries. This procedure is also used to evaluate the valves, as well as measure the blood flow and oxygen levels in different parts of your heart. For further information please visit [email or web address] Please follow instruction sheet, as given.    [facility] CARDIOVASCULAR DIVISION [facility] [place] [address], [address] [place] [address] Dept: [phone] Loc: [phone]  Verma Grothaus  03/01/2018  You are scheduled for a Cardiac Catheterization on Tuesday, May 7 with Dr. Tonny Bollman.  1. Please arrive at the [facility] (Main Entrance A) at [facility]: [address] [place], [place] [address] at 5:30 AM (two hours before your procedure to ensure your preparation). Free valet parking service is available.   Special note: Every effort is made to have your procedure done on time. Please understand that emergencies sometimes delay  scheduled procedures.  2. Diet: Do not eat or drink anything after midnight prior to your procedure except sips of water to take medications.  3. Labs: None needed. We are checking today.  4. Medication instructions in preparation for your procedure:  On the morning of your procedure, take your Aspirin and any morning medicines NOT listed above.  You may use sips of water.  5. Plan for one night stay--bring personal belongings. 6. Bring a current list of your medications and current insurance cards. 7. You MUST have a responsible person to drive you home. 8. Someone MUST be with you the first 24 hours after you arrive home or your discharge will be delayed. 9. Please wear clothes that are easy to get on and off and wear slip-on shoes.  Thank you for allowing [place] to care for you!   -- [facility]  Follow-Up: Your physician recommends that you schedule a follow-up appointment in: 6 weeks.  Any Other Special Instructions Will Be Listed Below (If Applicable).     If you need a refill on your cardiac medications before your next appointment, please call your pharmacy.

## 2018-03-01 NOTE — Telephone Encounter (Signed)
Patient scheduled for appointment today at 4 pm.

## 2018-03-02 LAB — CBC
Hematocrit: 40.8 % (ref 34.0–46.6)
Hemoglobin: 14.6 g/dL (ref 11.1–15.9)
MCH: 33.2 pg — AB (ref 26.6–33.0)
MCHC: 35.8 g/dL — AB (ref 31.5–35.7)
MCV: 93 fL (ref 79–97)
PLATELETS: 379 10*3/uL (ref 150–379)
RBC: 4.4 x10E6/uL (ref 3.77–5.28)
RDW: 13.5 % (ref 12.3–15.4)
WBC: 7.6 10*3/uL (ref 3.4–10.8)

## 2018-03-02 LAB — BASIC METABOLIC PANEL
BUN / CREAT RATIO: 16 (ref 12–28)
BUN: 14 mg/dL (ref 8–27)
CHLORIDE: 99 mmol/L (ref 96–106)
CO2: 24 mmol/L (ref 20–29)
Calcium: 9.5 mg/dL (ref 8.7–10.3)
Creatinine, Ser: 0.86 mg/dL (ref 0.57–1.00)
GFR calc Af Amer: 85 mL/min/{1.73_m2} (ref >59)
GFR calc non Af Amer: 74 mL/min/{1.73_m2} (ref >59)
Glucose: 70 mg/dL (ref 65–99)
POTASSIUM: 5.2 mmol/L (ref 3.5–5.2)
SODIUM: 137 mmol/L (ref 134–144)

## 2018-03-02 LAB — TROPONIN I

## 2018-03-06 ENCOUNTER — Telehealth: Payer: Self-pay | Admitting: *Deleted

## 2018-03-06 NOTE — Telephone Encounter (Addendum)
Catheterization scheduled at [facility] for: Tuesday Mar 07, 2018 7:30 AM Verify arrival time and place: [facility] Main Entrance A at: 5:30 AM.  No solid food after midnight prior to cath, clear liquids until 5 AM day of procedure. Verify allergies in Epic Verify no diabetes medications.  Hold: Spironolactone AM of cath-   AM meds can be  taken pre-cath with sip of water including: ASA 81 mg  Confirmed patient has responsible person to drive home post procedure and observe patient for 24 hours: yes  Discussed instructions with patient, she verbalized understanding, thanked me for call.

## 2018-03-07 ENCOUNTER — Ambulatory Visit (HOSPITAL_COMMUNITY)
Admission: RE | Admit: 2018-03-07 | Discharge: 2018-03-07 | Disposition: A | Discharge status: Home / Self Care | Payer: BLUE CROSS/BLUE SHIELD | Source: Ambulatory Visit | Attending: Cardiovascular Disease | Admitting: Cardiovascular Disease

## 2018-03-07 ENCOUNTER — Encounter (HOSPITAL_COMMUNITY): Payer: Self-pay | Admitting: Cardiovascular Disease

## 2018-03-07 ENCOUNTER — Encounter (HOSPITAL_COMMUNITY)
Admission: RE | Disposition: A | Discharge status: Home / Self Care | Payer: Self-pay | Source: Ambulatory Visit | Attending: Cardiovascular Disease

## 2018-03-07 DIAGNOSIS — Z823 Family history of stroke: Secondary | ICD-10-CM | POA: Insufficient documentation

## 2018-03-07 DIAGNOSIS — F17210 Nicotine dependence, cigarettes, uncomplicated: Secondary | ICD-10-CM | POA: Diagnosis not present

## 2018-03-07 DIAGNOSIS — Z8249 Family history of ischemic heart disease and other diseases of the circulatory system: Secondary | ICD-10-CM | POA: Diagnosis not present

## 2018-03-07 DIAGNOSIS — M1990 Unspecified osteoarthritis, unspecified site: Secondary | ICD-10-CM | POA: Insufficient documentation

## 2018-03-07 DIAGNOSIS — R9431 Abnormal electrocardiogram [ECG] [EKG]: Secondary | ICD-10-CM | POA: Diagnosis not present

## 2018-03-07 DIAGNOSIS — I2584 Coronary atherosclerosis due to calcified coronary lesion: Secondary | ICD-10-CM | POA: Diagnosis not present

## 2018-03-07 DIAGNOSIS — I2510 Atherosclerotic heart disease of native coronary artery without angina pectoris: Secondary | ICD-10-CM | POA: Diagnosis not present

## 2018-03-07 DIAGNOSIS — I998 Other disorder of circulatory system: Secondary | ICD-10-CM | POA: Insufficient documentation

## 2018-03-07 DIAGNOSIS — I10 Essential (primary) hypertension: Secondary | ICD-10-CM | POA: Diagnosis not present

## 2018-03-07 HISTORY — PX: LEFT HEART CATH AND CORONARY ANGIOGRAPHY: CATH118249

## 2018-03-07 SURGERY — LEFT HEART CATH AND CORONARY ANGIOGRAPHY
Anesthesia: LOCAL

## 2018-03-07 MED ORDER — LIDOCAINE HCL (PF) 1 % IJ SOLN
INTRAMUSCULAR | Status: DC | PRN
  Administered 2018-03-07: 2 mL

## 2018-03-07 MED ORDER — ASPIRIN 81 MG PO CHEW: 81.0000 mg | CHEWABLE_TABLET | ORAL | Status: DC

## 2018-03-07 MED ORDER — HEPARIN SODIUM (PORCINE) 1000 UNIT/ML IJ SOLN
INTRAMUSCULAR | Status: DC | PRN
  Administered 2018-03-07: 3000 [IU] via INTRAVENOUS

## 2018-03-07 MED ORDER — HEPARIN (PORCINE) IN NACL 2-0.9 UNITS/ML
INTRAMUSCULAR | Status: AC | PRN
  Administered 2018-03-07 (×2): 500 mL

## 2018-03-07 MED ORDER — HEPARIN (PORCINE) IN NACL 1000-0.9 UT/500ML-% IV SOLN
INTRAVENOUS | Status: AC
  Filled 2018-03-07: qty 1000

## 2018-03-07 MED ORDER — SODIUM CHLORIDE 0.9% FLUSH: 3.0000 mL | INTRAVENOUS | Status: DC | PRN

## 2018-03-07 MED ORDER — MIDAZOLAM HCL 2 MG/2ML IJ SOLN
INTRAMUSCULAR | Status: AC
  Filled 2018-03-07: qty 2

## 2018-03-07 MED ORDER — VERAPAMIL HCL 2.5 MG/ML IV SOLN
INTRAVENOUS | Status: AC
  Filled 2018-03-07: qty 2

## 2018-03-07 MED ORDER — IOHEXOL 350 MG/ML SOLN
INTRAVENOUS | Status: DC | PRN
  Administered 2018-03-07: 75 mL via INTRAVENOUS

## 2018-03-07 MED ORDER — ONDANSETRON HCL 4 MG/2ML IJ SOLN: 4.0000 mg | Freq: Four times a day (QID) | INTRAMUSCULAR | Status: DC | PRN

## 2018-03-07 MED ORDER — SODIUM CHLORIDE 0.9 % WEIGHT BASED INFUSION: 1.0000 mL/kg/h | INTRAVENOUS | Status: DC

## 2018-03-07 MED ORDER — SODIUM CHLORIDE 0.9 % WEIGHT BASED INFUSION
3.0000 mL/kg/h | INTRAVENOUS | Status: DC
  Administered 2018-03-07: 3 mL/kg/h via INTRAVENOUS

## 2018-03-07 MED ORDER — MIDAZOLAM HCL 2 MG/2ML IJ SOLN
INTRAMUSCULAR | Status: DC | PRN
  Administered 2018-03-07: 2 mg via INTRAVENOUS

## 2018-03-07 MED ORDER — ACETAMINOPHEN 325 MG PO TABS: 650.0000 mg | ORAL_TABLET | ORAL | Status: DC | PRN

## 2018-03-07 MED ORDER — LIDOCAINE HCL (PF) 1 % IJ SOLN
INTRAMUSCULAR | Status: AC
  Filled 2018-03-07: qty 30

## 2018-03-07 MED ORDER — SODIUM CHLORIDE 0.9 % IV SOLN: 250.0000 mL | INTRAVENOUS | Status: DC | PRN

## 2018-03-07 MED ORDER — HEPARIN SODIUM (PORCINE) 1000 UNIT/ML IJ SOLN
INTRAMUSCULAR | Status: AC
  Filled 2018-03-07: qty 1

## 2018-03-07 MED ORDER — SODIUM CHLORIDE 0.9% FLUSH: 3.0000 mL | Freq: Two times a day (BID) | INTRAVENOUS | Status: DC

## 2018-03-07 MED ORDER — VERAPAMIL HCL 2.5 MG/ML IV SOLN
INTRAVENOUS | Status: DC | PRN
  Administered 2018-03-07: 10 mL via INTRA_ARTERIAL

## 2018-03-07 MED ORDER — FENTANYL CITRATE (PF) 100 MCG/2ML IJ SOLN
INTRAMUSCULAR | Status: DC | PRN
  Administered 2018-03-07: 25 ug via INTRAVENOUS

## 2018-03-07 MED ORDER — FENTANYL CITRATE (PF) 100 MCG/2ML IJ SOLN
INTRAMUSCULAR | Status: AC
  Filled 2018-03-07: qty 2

## 2018-03-07 SURGICAL SUPPLY — 12 items
BAND ZEPHYR COMPRESS 30 LONG (HEMOSTASIS) ×2 IMPLANT
CATH INFINITI 5 FR JL3.5 (CATHETERS) ×2 IMPLANT
CATH INFINITI 5FR ANG PIGTAIL (CATHETERS) ×2 IMPLANT
CATH INFINITI JR4 5F (CATHETERS) ×2 IMPLANT
GLIDESHEATH SLEND SS 6F .021 (SHEATH) ×2 IMPLANT
GUIDEWIRE INQWIRE 1.5J.035X260 (WIRE) ×1 IMPLANT
INQWIRE 1.5J .035X260CM (WIRE) ×2
KIT HEART LEFT (KITS) ×2 IMPLANT
PACK CARDIAC CATHETERIZATION (CUSTOM PROCEDURE TRAY) ×2 IMPLANT
SYR MEDRAD MARK V 150ML (SYRINGE) ×2 IMPLANT
TRANSDUCER W/STOPCOCK (MISCELLANEOUS) ×2 IMPLANT
TUBING CIL FLEX 10 FLL-RA (TUBING) ×2 IMPLANT

## 2018-03-07 NOTE — Interval H&P Note (Signed)
History and Physical Interval Note:  03/07/2018 7:59 AM  Donna Waters  has presented today for surgery, with the diagnosis of abnormal ekg  The various methods of treatment have been discussed with the patient and family. After consideration of risks, benefits and other options for treatment, the patient has consented to  Procedure(s): LEFT HEART CATH AND CORONARY ANGIOGRAPHY (N/A) as a surgical intervention .  The patient's history has been reviewed, patient examined, no change in status, stable for surgery.  I have reviewed the patient's chart and labs.  Questions were answered to the patient's satisfaction.     Tonny Bollman

## 2018-03-07 NOTE — Discharge Instructions (Signed)

## 2018-03-08 MED FILL — Heparin Sod (Porcine)-NaCl IV Soln 1000 Unit/500ML-0.9%: INTRAVENOUS | Qty: 1000 | Status: AC

## 2018-03-09 ENCOUNTER — Telehealth: Payer: Self-pay | Admitting: Cardiology

## 2018-03-09 NOTE — Telephone Encounter (Signed)
Patient states that she was reading her discharge paperwork and it said not to get the cath site in water for 5 days. The patient also states that she was told to check the site every day, but she cannot do this with the gauze and tegaderm. Advised patient that she should not submerge the site in water such as taking a bath or washing dishes in a sink of water for 5 days. Advised that patient she could remove the original bandage and cover with a band aid until the site begins to heal. Patient verbalized understanding. No further questions.

## 2018-03-09 NOTE — Telephone Encounter (Signed)
Has questions about bandage on cath site

## 2018-03-15 ENCOUNTER — Other Ambulatory Visit: Payer: Self-pay

## 2018-03-15 ENCOUNTER — Telehealth: Payer: Self-pay | Admitting: Cardiology

## 2018-03-15 DIAGNOSIS — I10 Essential (primary) hypertension: Secondary | ICD-10-CM

## 2018-03-15 DIAGNOSIS — Z13220 Encounter for screening for lipoid disorders: Secondary | ICD-10-CM

## 2018-03-15 MED ORDER — CARVEDILOL 6.25 MG PO TABS: 6.2500 mg | ORAL_TABLET | Freq: Two times a day (BID) | ORAL | 2 refills | Status: DC

## 2018-03-15 MED ORDER — NEBIVOLOL HCL 5 MG PO TABS: 5.0000 mg | ORAL_TABLET | Freq: Every day | ORAL | 0 refills | Status: DC

## 2018-03-15 NOTE — Telephone Encounter (Signed)
Patient states that since starting her spironolactone at the beginning of May her blood pressure has continued to be high; systolic 170-180's and diastolic 80-90's. Per Dr. Dulce Sellar the patient is to start bystolic 5 mg q daily. Labs were also placed for CMP and lipid panel; informed patient to have done the end of this week or next week.

## 2018-03-15 NOTE — Telephone Encounter (Signed)
Her BP is sky high since starting that new medicine

## 2018-03-17 DIAGNOSIS — Z13220 Encounter for screening for lipoid disorders: Secondary | ICD-10-CM | POA: Diagnosis not present

## 2018-03-17 DIAGNOSIS — I10 Essential (primary) hypertension: Secondary | ICD-10-CM | POA: Diagnosis not present

## 2018-03-17 LAB — COMPREHENSIVE METABOLIC PANEL
A/G RATIO: 2.7 — AB (ref 1.2–2.2)
ALT: 52 IU/L — AB (ref 0–32)
AST: 43 IU/L — ABNORMAL HIGH (ref 0–40)
Albumin: 4.8 g/dL (ref 3.6–4.8)
Alkaline Phosphatase: 143 IU/L — ABNORMAL HIGH (ref 39–117)
BILIRUBIN TOTAL: 0.4 mg/dL (ref 0.0–1.2)
BUN/Creatinine Ratio: 13 (ref 12–28)
BUN: 12 mg/dL (ref 8–27)
CHLORIDE: 100 mmol/L (ref 96–106)
CO2: 26 mmol/L (ref 20–29)
Calcium: 9.7 mg/dL (ref 8.7–10.3)
Creatinine, Ser: 0.89 mg/dL (ref 0.57–1.00)
GFR calc Af Amer: 81 mL/min/{1.73_m2} (ref >59)
GFR calc non Af Amer: 71 mL/min/{1.73_m2} (ref >59)
GLUCOSE: 73 mg/dL (ref 65–99)
Globulin, Total: 1.8 g/dL (ref 1.5–4.5)
POTASSIUM: 5.6 mmol/L — AB (ref 3.5–5.2)
Sodium: 137 mmol/L (ref 134–144)
TOTAL PROTEIN: 6.6 g/dL (ref 6.0–8.5)

## 2018-03-18 LAB — LIPID PANEL
CHOLESTEROL TOTAL: 142 mg/dL (ref 100–199)
Chol/HDL Ratio: 1.8 ratio (ref 0.0–4.4)
HDL: 79 mg/dL (ref >39)
LDL CALC: 54 mg/dL (ref 0–99)
TRIGLYCERIDES: 47 mg/dL (ref 0–149)
VLDL Cholesterol Cal: 9 mg/dL (ref 5–40)

## 2018-03-20 ENCOUNTER — Telehealth: Payer: Self-pay

## 2018-03-20 MED ORDER — CLONIDINE HCL 0.1 MG PO TABS: 0.1000 mg | ORAL_TABLET | Freq: Every day | ORAL | 11 refills | Status: DC

## 2018-03-20 NOTE — Telephone Encounter (Signed)
Patient returned call. Patient informed of results. Advised patient to stop spironolactone.   Patient's blood pressure has still been elevated: 150/76, 176.92. Dr. Dulce Sellar advised to start clonidine (Catapres) 0.1 mg at bedtime. Patient verbalized understanding. No further questions.

## 2018-03-20 NOTE — Telephone Encounter (Signed)
-----   Message from Baldo Daub, MD sent at 03/17/2018  2:01 PM EDT ----- K is high stop spironolactone

## 2018-04-12 ENCOUNTER — Ambulatory Visit: Payer: BLUE CROSS/BLUE SHIELD | Admitting: Cardiology

## 2018-04-12 DIAGNOSIS — F320 Major depressive disorder, single episode, mild: Secondary | ICD-10-CM | POA: Diagnosis not present

## 2018-04-12 DIAGNOSIS — I10 Essential (primary) hypertension: Secondary | ICD-10-CM | POA: Diagnosis not present

## 2018-04-12 DIAGNOSIS — F419 Anxiety disorder, unspecified: Secondary | ICD-10-CM | POA: Diagnosis not present

## 2018-04-19 ENCOUNTER — Telehealth: Payer: Self-pay | Admitting: Cardiology

## 2018-04-19 DIAGNOSIS — I10 Essential (primary) hypertension: Secondary | ICD-10-CM | POA: Diagnosis not present

## 2018-04-19 NOTE — Telephone Encounter (Signed)
Wants you to call her about this pt

## 2018-04-19 NOTE — Telephone Encounter (Signed)
Donna Waters states Donna AthensMelissa Rose, NP wants to know if she can increase the patient's clonidine to 0.2 mg twice daily. Advised that our records show that on 03/20/18 the patient was started on clonidine 0.1 mg daily at bedtime. Melissa then asked if the patient could take 0.1 mg twice daily. Patient's blood pressure in the office last week was 168/98 and today was 158/98.   Reviewed with Dr. Tomie Chinaevankar, he advised the patient can take clonidine 0.1 mg twice daily. Donna Waters verbalized understanding. No further questions.

## 2018-04-20 DIAGNOSIS — M25511 Pain in right shoulder: Secondary | ICD-10-CM | POA: Diagnosis not present

## 2018-05-10 DIAGNOSIS — F1011 Alcohol abuse, in remission: Secondary | ICD-10-CM | POA: Diagnosis not present

## 2018-05-10 DIAGNOSIS — I10 Essential (primary) hypertension: Secondary | ICD-10-CM | POA: Diagnosis not present

## 2018-05-10 DIAGNOSIS — F419 Anxiety disorder, unspecified: Secondary | ICD-10-CM | POA: Diagnosis not present

## 2018-05-10 DIAGNOSIS — M542 Cervicalgia: Secondary | ICD-10-CM | POA: Diagnosis not present

## 2018-06-21 DIAGNOSIS — N959 Unspecified menopausal and perimenopausal disorder: Secondary | ICD-10-CM | POA: Diagnosis not present

## 2018-06-21 DIAGNOSIS — Z01419 Encounter for gynecological examination (general) (routine) without abnormal findings: Secondary | ICD-10-CM | POA: Diagnosis not present

## 2018-06-21 DIAGNOSIS — R87613 High grade squamous intraepithelial lesion on cytologic smear of cervix (HGSIL): Secondary | ICD-10-CM | POA: Diagnosis not present

## 2018-06-21 DIAGNOSIS — I10 Essential (primary) hypertension: Secondary | ICD-10-CM | POA: Diagnosis not present

## 2018-06-21 DIAGNOSIS — Z87891 Personal history of nicotine dependence: Secondary | ICD-10-CM | POA: Diagnosis not present

## 2018-06-21 DIAGNOSIS — Z1389 Encounter for screening for other disorder: Secondary | ICD-10-CM | POA: Diagnosis not present

## 2018-06-21 DIAGNOSIS — Z0000 Encounter for general adult medical examination without abnormal findings: Secondary | ICD-10-CM | POA: Diagnosis not present

## 2018-07-21 DIAGNOSIS — M810 Age-related osteoporosis without current pathological fracture: Secondary | ICD-10-CM | POA: Diagnosis not present

## 2018-07-21 DIAGNOSIS — N959 Unspecified menopausal and perimenopausal disorder: Secondary | ICD-10-CM | POA: Diagnosis not present

## 2018-07-31 DIAGNOSIS — Z1231 Encounter for screening mammogram for malignant neoplasm of breast: Secondary | ICD-10-CM | POA: Diagnosis not present

## 2018-08-21 DIAGNOSIS — D061 Carcinoma in situ of exocervix: Secondary | ICD-10-CM | POA: Diagnosis not present

## 2018-08-21 DIAGNOSIS — N72 Inflammatory disease of cervix uteri: Secondary | ICD-10-CM | POA: Diagnosis not present

## 2018-08-21 DIAGNOSIS — R87613 High grade squamous intraepithelial lesion on cytologic smear of cervix (HGSIL): Secondary | ICD-10-CM | POA: Diagnosis not present

## 2018-09-05 DIAGNOSIS — Z23 Encounter for immunization: Secondary | ICD-10-CM | POA: Diagnosis not present

## 2018-10-09 DIAGNOSIS — R87613 High grade squamous intraepithelial lesion on cytologic smear of cervix (HGSIL): Secondary | ICD-10-CM | POA: Diagnosis not present

## 2018-10-09 DIAGNOSIS — Z720 Tobacco use: Secondary | ICD-10-CM | POA: Diagnosis not present

## 2018-10-09 DIAGNOSIS — R102 Pelvic and perineal pain: Secondary | ICD-10-CM | POA: Diagnosis not present

## 2018-10-09 DIAGNOSIS — D069 Carcinoma in situ of cervix, unspecified: Secondary | ICD-10-CM | POA: Diagnosis not present

## 2018-10-19 DIAGNOSIS — D060 Carcinoma in situ of endocervix: Secondary | ICD-10-CM | POA: Diagnosis not present

## 2018-10-19 DIAGNOSIS — F419 Anxiety disorder, unspecified: Secondary | ICD-10-CM | POA: Diagnosis not present

## 2018-10-19 DIAGNOSIS — N83322 Acquired atrophy of left fallopian tube: Secondary | ICD-10-CM | POA: Diagnosis not present

## 2018-10-19 DIAGNOSIS — N83201 Unspecified ovarian cyst, right side: Secondary | ICD-10-CM | POA: Diagnosis not present

## 2018-10-19 DIAGNOSIS — I10 Essential (primary) hypertension: Secondary | ICD-10-CM | POA: Diagnosis not present

## 2018-10-19 DIAGNOSIS — F17210 Nicotine dependence, cigarettes, uncomplicated: Secondary | ICD-10-CM | POA: Diagnosis not present

## 2018-10-19 DIAGNOSIS — D251 Intramural leiomyoma of uterus: Secondary | ICD-10-CM | POA: Diagnosis not present

## 2018-10-19 DIAGNOSIS — N83321 Acquired atrophy of right fallopian tube: Secondary | ICD-10-CM | POA: Diagnosis not present

## 2018-10-19 DIAGNOSIS — N83202 Unspecified ovarian cyst, left side: Secondary | ICD-10-CM | POA: Diagnosis not present

## 2018-10-19 DIAGNOSIS — N801 Endometriosis of ovary: Secondary | ICD-10-CM | POA: Diagnosis not present

## 2018-10-19 DIAGNOSIS — R87613 High grade squamous intraepithelial lesion on cytologic smear of cervix (HGSIL): Secondary | ICD-10-CM | POA: Diagnosis not present

## 2018-10-19 DIAGNOSIS — Z720 Tobacco use: Secondary | ICD-10-CM | POA: Diagnosis not present

## 2018-10-19 DIAGNOSIS — N800 Endometriosis of uterus: Secondary | ICD-10-CM | POA: Diagnosis not present

## 2018-10-19 DIAGNOSIS — R102 Pelvic and perineal pain: Secondary | ICD-10-CM | POA: Diagnosis not present

## 2018-10-19 DIAGNOSIS — D069 Carcinoma in situ of cervix, unspecified: Secondary | ICD-10-CM | POA: Diagnosis not present

## 2018-10-19 DIAGNOSIS — Z79899 Other long term (current) drug therapy: Secondary | ICD-10-CM | POA: Diagnosis not present

## 2018-10-19 HISTORY — PX: TOTAL VAGINAL HYSTERECTOMY: SHX2548

## 2018-10-20 DIAGNOSIS — D060 Carcinoma in situ of endocervix: Secondary | ICD-10-CM | POA: Diagnosis not present

## 2018-10-20 DIAGNOSIS — D069 Carcinoma in situ of cervix, unspecified: Secondary | ICD-10-CM | POA: Diagnosis not present

## 2018-10-20 DIAGNOSIS — N83321 Acquired atrophy of right fallopian tube: Secondary | ICD-10-CM | POA: Diagnosis not present

## 2018-10-20 DIAGNOSIS — I10 Essential (primary) hypertension: Secondary | ICD-10-CM | POA: Diagnosis not present

## 2018-10-20 DIAGNOSIS — N83201 Unspecified ovarian cyst, right side: Secondary | ICD-10-CM | POA: Diagnosis not present

## 2018-10-20 DIAGNOSIS — D251 Intramural leiomyoma of uterus: Secondary | ICD-10-CM | POA: Diagnosis not present

## 2018-10-20 DIAGNOSIS — N83322 Acquired atrophy of left fallopian tube: Secondary | ICD-10-CM | POA: Diagnosis not present

## 2018-10-20 DIAGNOSIS — N83202 Unspecified ovarian cyst, left side: Secondary | ICD-10-CM | POA: Diagnosis not present

## 2018-10-20 DIAGNOSIS — F419 Anxiety disorder, unspecified: Secondary | ICD-10-CM | POA: Diagnosis not present

## 2018-10-20 DIAGNOSIS — N801 Endometriosis of ovary: Secondary | ICD-10-CM | POA: Diagnosis not present

## 2018-10-20 DIAGNOSIS — F17210 Nicotine dependence, cigarettes, uncomplicated: Secondary | ICD-10-CM | POA: Diagnosis not present

## 2018-10-20 DIAGNOSIS — Z79899 Other long term (current) drug therapy: Secondary | ICD-10-CM | POA: Diagnosis not present

## 2018-10-20 DIAGNOSIS — N800 Endometriosis of uterus: Secondary | ICD-10-CM | POA: Diagnosis not present

## 2018-11-08 DIAGNOSIS — M810 Age-related osteoporosis without current pathological fracture: Secondary | ICD-10-CM | POA: Diagnosis not present

## 2018-11-08 DIAGNOSIS — Z720 Tobacco use: Secondary | ICD-10-CM | POA: Diagnosis not present

## 2018-11-08 DIAGNOSIS — I10 Essential (primary) hypertension: Secondary | ICD-10-CM | POA: Diagnosis not present

## 2018-12-06 DIAGNOSIS — I10 Essential (primary) hypertension: Secondary | ICD-10-CM | POA: Diagnosis not present

## 2018-12-06 DIAGNOSIS — M810 Age-related osteoporosis without current pathological fracture: Secondary | ICD-10-CM | POA: Diagnosis not present

## 2018-12-06 DIAGNOSIS — Z720 Tobacco use: Secondary | ICD-10-CM | POA: Diagnosis not present

## 2019-05-29 DIAGNOSIS — M810 Age-related osteoporosis without current pathological fracture: Secondary | ICD-10-CM | POA: Diagnosis not present

## 2019-05-29 DIAGNOSIS — I10 Essential (primary) hypertension: Secondary | ICD-10-CM | POA: Diagnosis not present

## 2019-05-29 DIAGNOSIS — R5383 Other fatigue: Secondary | ICD-10-CM | POA: Diagnosis not present

## 2019-05-29 DIAGNOSIS — Z720 Tobacco use: Secondary | ICD-10-CM | POA: Diagnosis not present

## 2019-06-12 DIAGNOSIS — I10 Essential (primary) hypertension: Secondary | ICD-10-CM | POA: Diagnosis not present

## 2019-08-15 DIAGNOSIS — Z1231 Encounter for screening mammogram for malignant neoplasm of breast: Secondary | ICD-10-CM | POA: Diagnosis not present

## 2019-08-19 DIAGNOSIS — M546 Pain in thoracic spine: Secondary | ICD-10-CM | POA: Diagnosis not present

## 2019-09-05 DIAGNOSIS — R1013 Epigastric pain: Secondary | ICD-10-CM | POA: Diagnosis not present

## 2019-09-05 DIAGNOSIS — R634 Abnormal weight loss: Secondary | ICD-10-CM | POA: Diagnosis not present

## 2019-09-05 DIAGNOSIS — I10 Essential (primary) hypertension: Secondary | ICD-10-CM | POA: Diagnosis not present

## 2019-09-05 DIAGNOSIS — M810 Age-related osteoporosis without current pathological fracture: Secondary | ICD-10-CM | POA: Diagnosis not present

## 2019-10-08 DIAGNOSIS — Z0000 Encounter for general adult medical examination without abnormal findings: Secondary | ICD-10-CM | POA: Diagnosis not present

## 2019-10-08 DIAGNOSIS — M810 Age-related osteoporosis without current pathological fracture: Secondary | ICD-10-CM | POA: Diagnosis not present

## 2019-10-08 DIAGNOSIS — Z6822 Body mass index (BMI) 22.0-22.9, adult: Secondary | ICD-10-CM | POA: Diagnosis not present

## 2019-10-08 DIAGNOSIS — I10 Essential (primary) hypertension: Secondary | ICD-10-CM | POA: Diagnosis not present

## 2019-10-08 DIAGNOSIS — Z1331 Encounter for screening for depression: Secondary | ICD-10-CM | POA: Diagnosis not present

## 2020-01-08 DIAGNOSIS — I10 Essential (primary) hypertension: Secondary | ICD-10-CM | POA: Diagnosis not present

## 2020-01-08 DIAGNOSIS — Z720 Tobacco use: Secondary | ICD-10-CM | POA: Diagnosis not present

## 2020-01-08 DIAGNOSIS — M810 Age-related osteoporosis without current pathological fracture: Secondary | ICD-10-CM | POA: Diagnosis not present

## 2020-01-08 DIAGNOSIS — F419 Anxiety disorder, unspecified: Secondary | ICD-10-CM | POA: Diagnosis not present

## 2020-04-09 DIAGNOSIS — M810 Age-related osteoporosis without current pathological fracture: Secondary | ICD-10-CM | POA: Diagnosis not present

## 2020-04-09 DIAGNOSIS — F419 Anxiety disorder, unspecified: Secondary | ICD-10-CM | POA: Diagnosis not present

## 2020-04-09 DIAGNOSIS — I10 Essential (primary) hypertension: Secondary | ICD-10-CM | POA: Diagnosis not present

## 2020-04-09 DIAGNOSIS — Z720 Tobacco use: Secondary | ICD-10-CM | POA: Diagnosis not present

## 2020-04-21 IMAGING — DX DG CHEST 2V
2 series · 2 of 2 positions shown · non-contrast
Comparison: None in PACs

CLINICAL DATA: Pre cardiac catheterization exam. History of
hypertension, current smoker.

EXAM:
CHEST - 2 VIEW

[chest pa]
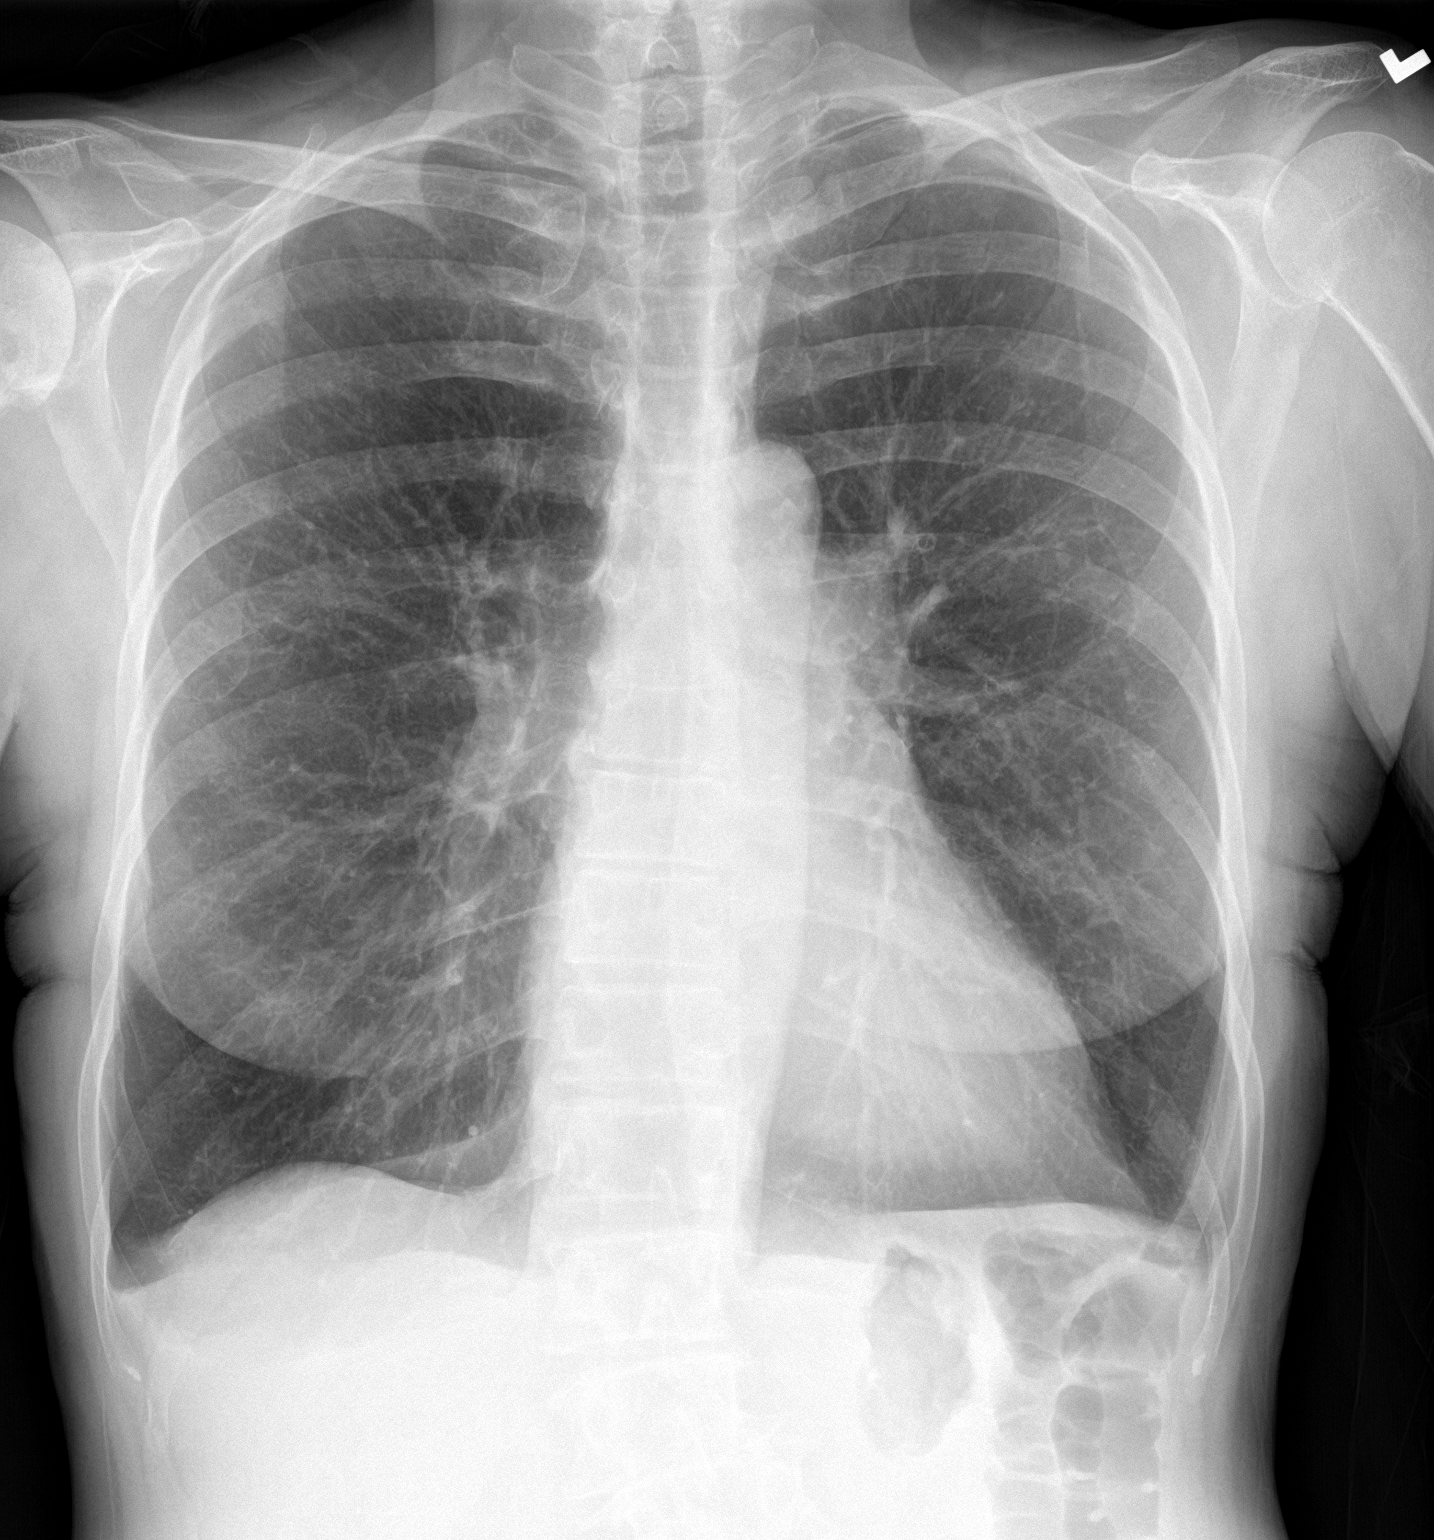

[chest lat]
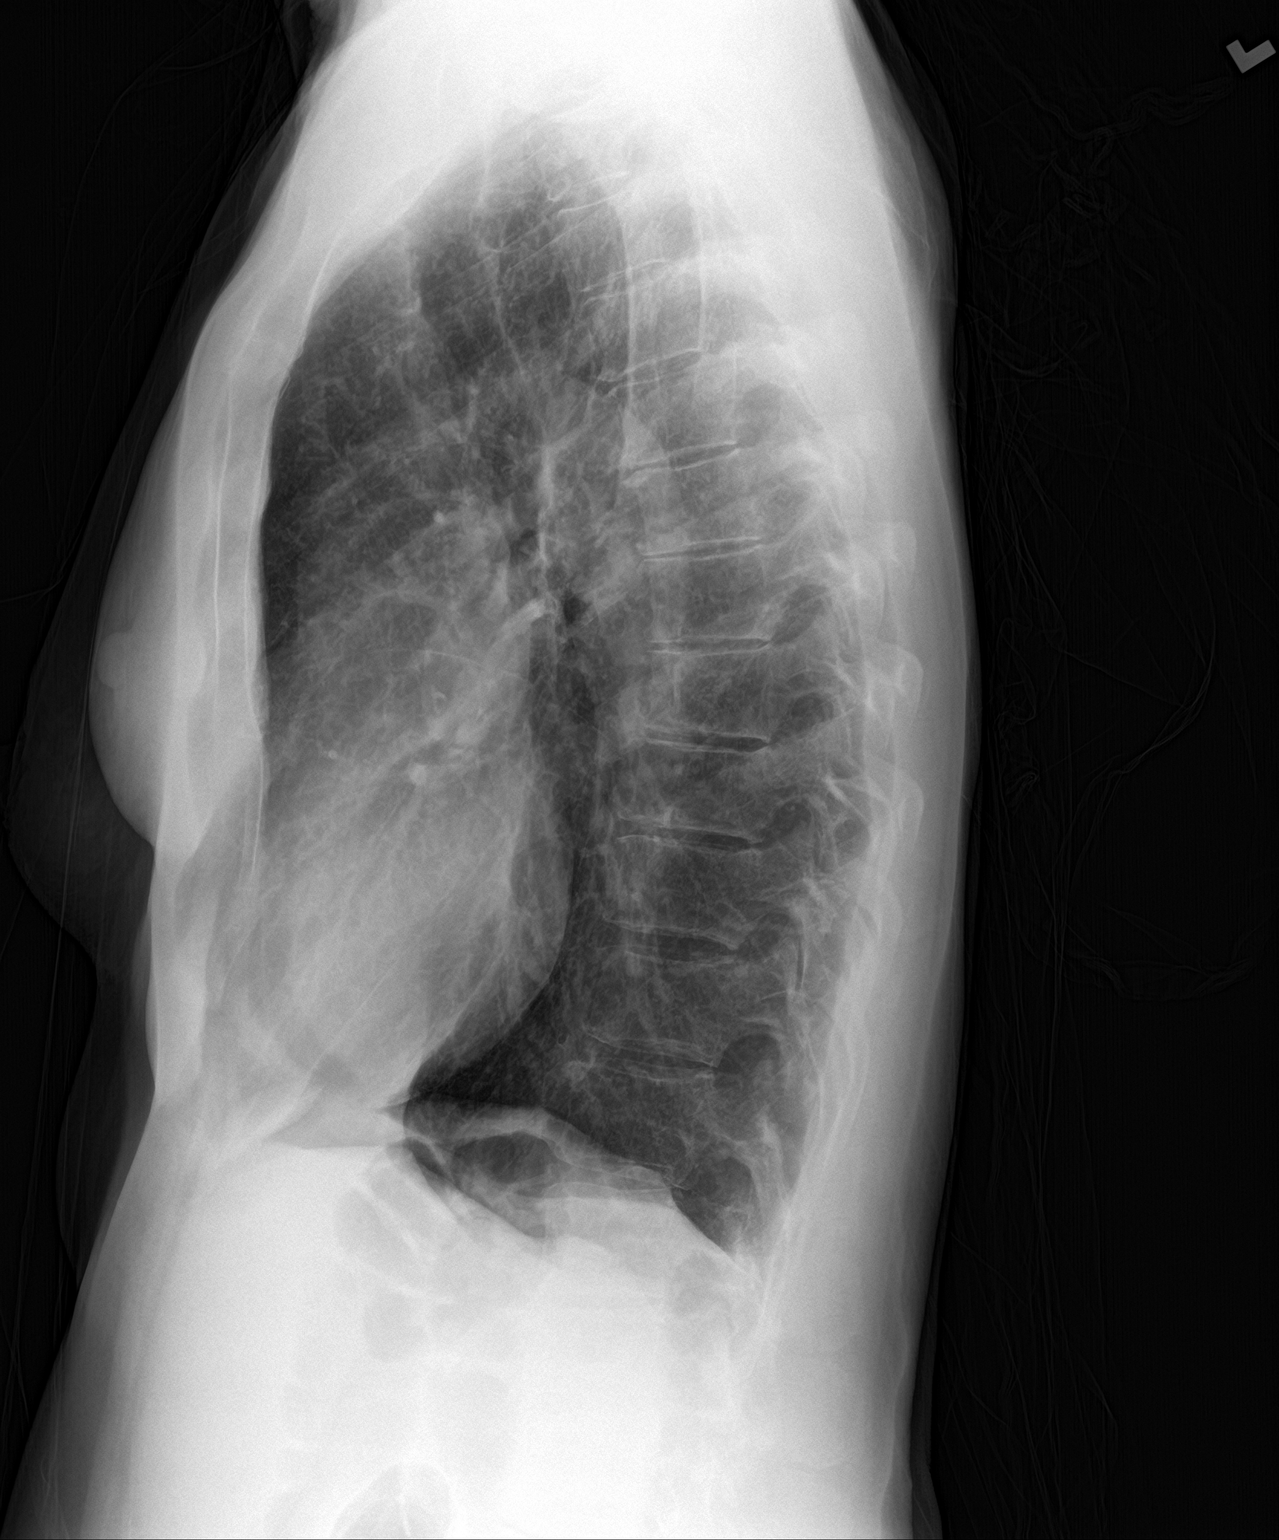

[2 of 2 positions shown; findings below may reference images not displayed]

FINDINGS: The lungs are hyperinflated with hemidiaphragm flattening. The heart
and pulmonary vascularity are normal. The mediastinum is normal in
width. There is no pleural effusion. The trachea is midline. The
bony thorax exhibits no acute abnormality.
IMPRESSION: COPD.  No pneumonia, CHF, or other active cardiopulmonary disease.

## 2020-06-23 DIAGNOSIS — Z20828 Contact with and (suspected) exposure to other viral communicable diseases: Secondary | ICD-10-CM | POA: Diagnosis not present

## 2020-07-14 DIAGNOSIS — F419 Anxiety disorder, unspecified: Secondary | ICD-10-CM | POA: Diagnosis not present

## 2020-07-14 DIAGNOSIS — M810 Age-related osteoporosis without current pathological fracture: Secondary | ICD-10-CM | POA: Diagnosis not present

## 2020-07-14 DIAGNOSIS — Z720 Tobacco use: Secondary | ICD-10-CM | POA: Diagnosis not present

## 2020-07-14 DIAGNOSIS — I10 Essential (primary) hypertension: Secondary | ICD-10-CM | POA: Diagnosis not present

## 2020-07-14 DIAGNOSIS — Z23 Encounter for immunization: Secondary | ICD-10-CM | POA: Diagnosis not present

## 2020-08-20 DIAGNOSIS — Z1231 Encounter for screening mammogram for malignant neoplasm of breast: Secondary | ICD-10-CM | POA: Diagnosis not present

## 2020-08-21 DIAGNOSIS — M5030 Other cervical disc degeneration, unspecified cervical region: Secondary | ICD-10-CM | POA: Diagnosis not present

## 2020-08-21 DIAGNOSIS — M546 Pain in thoracic spine: Secondary | ICD-10-CM | POA: Diagnosis not present

## 2020-08-21 DIAGNOSIS — M25511 Pain in right shoulder: Secondary | ICD-10-CM | POA: Diagnosis not present

## 2020-08-21 DIAGNOSIS — M542 Cervicalgia: Secondary | ICD-10-CM | POA: Diagnosis not present

## 2020-10-13 DIAGNOSIS — I10 Essential (primary) hypertension: Secondary | ICD-10-CM | POA: Diagnosis not present

## 2020-10-13 DIAGNOSIS — J441 Chronic obstructive pulmonary disease with (acute) exacerbation: Secondary | ICD-10-CM | POA: Diagnosis not present

## 2020-10-13 DIAGNOSIS — F419 Anxiety disorder, unspecified: Secondary | ICD-10-CM | POA: Diagnosis not present

## 2020-10-13 DIAGNOSIS — Z1331 Encounter for screening for depression: Secondary | ICD-10-CM | POA: Diagnosis not present

## 2020-10-13 DIAGNOSIS — Z0000 Encounter for general adult medical examination without abnormal findings: Secondary | ICD-10-CM | POA: Diagnosis not present

## 2020-10-13 DIAGNOSIS — R918 Other nonspecific abnormal finding of lung field: Secondary | ICD-10-CM | POA: Diagnosis not present

## 2020-10-13 DIAGNOSIS — J189 Pneumonia, unspecified organism: Secondary | ICD-10-CM | POA: Diagnosis not present

## 2020-10-13 DIAGNOSIS — Z720 Tobacco use: Secondary | ICD-10-CM | POA: Diagnosis not present

## 2020-10-14 DIAGNOSIS — J189 Pneumonia, unspecified organism: Secondary | ICD-10-CM | POA: Diagnosis not present

## 2020-10-14 DIAGNOSIS — Z20822 Contact with and (suspected) exposure to covid-19: Secondary | ICD-10-CM | POA: Diagnosis not present

## 2020-10-14 DIAGNOSIS — R6889 Other general symptoms and signs: Secondary | ICD-10-CM | POA: Diagnosis not present

## 2020-11-11 DIAGNOSIS — Z0000 Encounter for general adult medical examination without abnormal findings: Secondary | ICD-10-CM | POA: Diagnosis not present

## 2020-11-20 DIAGNOSIS — E538 Deficiency of other specified B group vitamins: Secondary | ICD-10-CM | POA: Diagnosis not present

## 2020-11-20 DIAGNOSIS — J441 Chronic obstructive pulmonary disease with (acute) exacerbation: Secondary | ICD-10-CM | POA: Diagnosis not present

## 2021-05-11 DIAGNOSIS — I10 Essential (primary) hypertension: Secondary | ICD-10-CM | POA: Diagnosis not present

## 2021-05-11 DIAGNOSIS — Z720 Tobacco use: Secondary | ICD-10-CM | POA: Diagnosis not present

## 2021-05-11 DIAGNOSIS — J449 Chronic obstructive pulmonary disease, unspecified: Secondary | ICD-10-CM | POA: Diagnosis not present

## 2021-05-11 DIAGNOSIS — F419 Anxiety disorder, unspecified: Secondary | ICD-10-CM | POA: Diagnosis not present

## 2021-05-12 DIAGNOSIS — I10 Essential (primary) hypertension: Secondary | ICD-10-CM | POA: Diagnosis not present

## 2021-08-25 DIAGNOSIS — Z1239 Encounter for other screening for malignant neoplasm of breast: Secondary | ICD-10-CM | POA: Diagnosis not present

## 2021-08-25 DIAGNOSIS — Z1231 Encounter for screening mammogram for malignant neoplasm of breast: Secondary | ICD-10-CM | POA: Diagnosis not present

## 2021-11-03 DIAGNOSIS — J209 Acute bronchitis, unspecified: Secondary | ICD-10-CM | POA: Diagnosis not present

## 2021-11-03 DIAGNOSIS — J440 Chronic obstructive pulmonary disease with acute lower respiratory infection: Secondary | ICD-10-CM | POA: Diagnosis not present

## 2021-11-11 DIAGNOSIS — Z1331 Encounter for screening for depression: Secondary | ICD-10-CM | POA: Diagnosis not present

## 2021-11-11 DIAGNOSIS — J449 Chronic obstructive pulmonary disease, unspecified: Secondary | ICD-10-CM | POA: Diagnosis not present

## 2021-11-11 DIAGNOSIS — F419 Anxiety disorder, unspecified: Secondary | ICD-10-CM | POA: Diagnosis not present

## 2021-11-11 DIAGNOSIS — Z720 Tobacco use: Secondary | ICD-10-CM | POA: Diagnosis not present

## 2021-11-11 DIAGNOSIS — I10 Essential (primary) hypertension: Secondary | ICD-10-CM | POA: Diagnosis not present

## 2022-02-15 DIAGNOSIS — F1010 Alcohol abuse, uncomplicated: Secondary | ICD-10-CM | POA: Diagnosis not present

## 2022-02-15 DIAGNOSIS — R296 Repeated falls: Secondary | ICD-10-CM | POA: Diagnosis not present

## 2022-05-12 DIAGNOSIS — F419 Anxiety disorder, unspecified: Secondary | ICD-10-CM | POA: Diagnosis not present

## 2022-05-12 DIAGNOSIS — J449 Chronic obstructive pulmonary disease, unspecified: Secondary | ICD-10-CM | POA: Diagnosis not present

## 2022-05-12 DIAGNOSIS — Z720 Tobacco use: Secondary | ICD-10-CM | POA: Diagnosis not present

## 2022-05-12 DIAGNOSIS — I10 Essential (primary) hypertension: Secondary | ICD-10-CM | POA: Diagnosis not present

## 2022-06-01 DIAGNOSIS — E875 Hyperkalemia: Secondary | ICD-10-CM | POA: Diagnosis not present

## 2022-06-18 DIAGNOSIS — G8929 Other chronic pain: Secondary | ICD-10-CM | POA: Diagnosis not present

## 2022-06-18 DIAGNOSIS — M546 Pain in thoracic spine: Secondary | ICD-10-CM | POA: Diagnosis not present

## 2022-06-18 DIAGNOSIS — M545 Low back pain, unspecified: Secondary | ICD-10-CM | POA: Diagnosis not present

## 2022-07-02 DIAGNOSIS — R059 Cough, unspecified: Secondary | ICD-10-CM | POA: Diagnosis not present

## 2022-07-02 DIAGNOSIS — R0602 Shortness of breath: Secondary | ICD-10-CM | POA: Diagnosis not present

## 2022-07-11 DIAGNOSIS — R531 Weakness: Secondary | ICD-10-CM | POA: Diagnosis not present

## 2022-07-11 DIAGNOSIS — I340 Nonrheumatic mitral (valve) insufficiency: Secondary | ICD-10-CM | POA: Diagnosis not present

## 2022-07-11 DIAGNOSIS — U071 COVID-19: Secondary | ICD-10-CM | POA: Diagnosis not present

## 2022-07-11 DIAGNOSIS — R0602 Shortness of breath: Secondary | ICD-10-CM | POA: Diagnosis not present

## 2022-07-11 DIAGNOSIS — R29700 NIHSS score 0: Secondary | ICD-10-CM | POA: Diagnosis not present

## 2022-07-11 DIAGNOSIS — F419 Anxiety disorder, unspecified: Secondary | ICD-10-CM | POA: Diagnosis not present

## 2022-07-11 DIAGNOSIS — I10 Essential (primary) hypertension: Secondary | ICD-10-CM | POA: Diagnosis not present

## 2022-07-11 DIAGNOSIS — F17210 Nicotine dependence, cigarettes, uncomplicated: Secondary | ICD-10-CM | POA: Diagnosis not present

## 2022-07-11 DIAGNOSIS — I160 Hypertensive urgency: Secondary | ICD-10-CM | POA: Diagnosis not present

## 2022-07-11 DIAGNOSIS — I639 Cerebral infarction, unspecified: Secondary | ICD-10-CM | POA: Diagnosis not present

## 2022-07-11 DIAGNOSIS — Z888 Allergy status to other drugs, medicaments and biological substances status: Secondary | ICD-10-CM | POA: Diagnosis not present

## 2022-07-11 DIAGNOSIS — J449 Chronic obstructive pulmonary disease, unspecified: Secondary | ICD-10-CM | POA: Diagnosis not present

## 2022-07-11 DIAGNOSIS — I4892 Unspecified atrial flutter: Secondary | ICD-10-CM | POA: Diagnosis not present

## 2022-07-11 DIAGNOSIS — H814 Vertigo of central origin: Secondary | ICD-10-CM | POA: Diagnosis not present

## 2022-07-11 DIAGNOSIS — R059 Cough, unspecified: Secondary | ICD-10-CM | POA: Diagnosis not present

## 2022-07-11 DIAGNOSIS — Z79899 Other long term (current) drug therapy: Secondary | ICD-10-CM | POA: Diagnosis not present

## 2022-07-11 DIAGNOSIS — R778 Other specified abnormalities of plasma proteins: Secondary | ICD-10-CM | POA: Diagnosis not present

## 2022-07-11 DIAGNOSIS — Z7982 Long term (current) use of aspirin: Secondary | ICD-10-CM | POA: Diagnosis not present

## 2022-07-11 DIAGNOSIS — R42 Dizziness and giddiness: Secondary | ICD-10-CM | POA: Diagnosis not present

## 2022-07-11 DIAGNOSIS — M1990 Unspecified osteoarthritis, unspecified site: Secondary | ICD-10-CM | POA: Diagnosis not present

## 2022-07-12 ENCOUNTER — Encounter: Payer: Self-pay | Admitting: Internal Medicine

## 2022-07-12 DIAGNOSIS — I340 Nonrheumatic mitral (valve) insufficiency: Secondary | ICD-10-CM | POA: Diagnosis not present

## 2022-07-20 DIAGNOSIS — I4891 Unspecified atrial fibrillation: Secondary | ICD-10-CM | POA: Diagnosis not present

## 2022-07-20 DIAGNOSIS — R112 Nausea with vomiting, unspecified: Secondary | ICD-10-CM | POA: Diagnosis not present

## 2022-07-20 DIAGNOSIS — H814 Vertigo of central origin: Secondary | ICD-10-CM | POA: Diagnosis not present

## 2022-07-20 DIAGNOSIS — I639 Cerebral infarction, unspecified: Secondary | ICD-10-CM | POA: Diagnosis not present

## 2022-07-29 ENCOUNTER — Other Ambulatory Visit: Payer: Self-pay

## 2022-07-29 DIAGNOSIS — I4891 Unspecified atrial fibrillation: Secondary | ICD-10-CM | POA: Insufficient documentation

## 2022-07-29 DIAGNOSIS — Z720 Tobacco use: Secondary | ICD-10-CM | POA: Insufficient documentation

## 2022-07-29 DIAGNOSIS — J449 Chronic obstructive pulmonary disease, unspecified: Secondary | ICD-10-CM | POA: Insufficient documentation

## 2022-07-29 DIAGNOSIS — E43 Unspecified severe protein-calorie malnutrition: Secondary | ICD-10-CM | POA: Insufficient documentation

## 2022-07-29 DIAGNOSIS — F419 Anxiety disorder, unspecified: Secondary | ICD-10-CM | POA: Insufficient documentation

## 2022-07-29 DIAGNOSIS — M5412 Radiculopathy, cervical region: Secondary | ICD-10-CM | POA: Insufficient documentation

## 2022-07-29 DIAGNOSIS — I160 Hypertensive urgency: Secondary | ICD-10-CM | POA: Insufficient documentation

## 2022-07-29 DIAGNOSIS — M4156 Other secondary scoliosis, lumbar region: Secondary | ICD-10-CM | POA: Insufficient documentation

## 2022-07-29 DIAGNOSIS — U071 COVID-19: Secondary | ICD-10-CM | POA: Insufficient documentation

## 2022-07-29 DIAGNOSIS — R42 Dizziness and giddiness: Secondary | ICD-10-CM | POA: Insufficient documentation

## 2022-07-29 DIAGNOSIS — M1990 Unspecified osteoarthritis, unspecified site: Secondary | ICD-10-CM | POA: Insufficient documentation

## 2022-07-29 DIAGNOSIS — I639 Cerebral infarction, unspecified: Secondary | ICD-10-CM | POA: Insufficient documentation

## 2022-07-29 DIAGNOSIS — I10 Essential (primary) hypertension: Secondary | ICD-10-CM | POA: Insufficient documentation

## 2022-07-29 DIAGNOSIS — Z8541 Personal history of malignant neoplasm of cervix uteri: Secondary | ICD-10-CM | POA: Insufficient documentation

## 2022-07-29 DIAGNOSIS — K219 Gastro-esophageal reflux disease without esophagitis: Secondary | ICD-10-CM | POA: Insufficient documentation

## 2022-07-29 DIAGNOSIS — F17200 Nicotine dependence, unspecified, uncomplicated: Secondary | ICD-10-CM | POA: Insufficient documentation

## 2022-07-29 DIAGNOSIS — H814 Vertigo of central origin: Secondary | ICD-10-CM | POA: Insufficient documentation

## 2022-07-29 DIAGNOSIS — I4892 Unspecified atrial flutter: Secondary | ICD-10-CM | POA: Insufficient documentation

## 2022-08-06 ENCOUNTER — Encounter: Payer: Self-pay | Admitting: Cardiology

## 2022-08-06 ENCOUNTER — Ambulatory Visit: Payer: BC Managed Care – PPO | Attending: Cardiology | Admitting: Cardiology

## 2022-08-06 VITALS — BP 168/82 | HR 88 | Ht 68.0 in | Wt 114.2 lb

## 2022-08-06 DIAGNOSIS — F17200 Nicotine dependence, unspecified, uncomplicated: Secondary | ICD-10-CM

## 2022-08-06 DIAGNOSIS — I10 Essential (primary) hypertension: Secondary | ICD-10-CM

## 2022-08-06 DIAGNOSIS — I4892 Unspecified atrial flutter: Secondary | ICD-10-CM

## 2022-08-06 DIAGNOSIS — I639 Cerebral infarction, unspecified: Secondary | ICD-10-CM

## 2022-08-06 DIAGNOSIS — F17210 Nicotine dependence, cigarettes, uncomplicated: Secondary | ICD-10-CM

## 2022-08-06 DIAGNOSIS — E782 Mixed hyperlipidemia: Secondary | ICD-10-CM

## 2022-08-06 HISTORY — DX: Mixed hyperlipidemia: E78.2

## 2022-08-06 NOTE — Progress Notes (Signed)
Cardiology Office Note:    Date:  08/06/2022   ID:  Donna Waters, DOB 10/22/1956, MRN [id number]  PCP:  Paulina Fusi, MD  Cardiologist:  Garwin Brothers, MD   Referring MD: Paulina Fusi, MD    ASSESSMENT:    1. Essential hypertension   2. Acute ischemic stroke (HCC)   3. Atrial flutter with rapid ventricular response (HCC)   4. Primary hypertension   5. Current smoker   6. Mixed dyslipidemia    PLAN:    In order of problems listed above:  Primary prevention stressed with the patient.  Importance of compliance with diet and medication stressed and she vocalized understanding Cigarette smoker: I spent 5 minutes with the patient discussing solely about smoking. Smoking cessation was counseled. I suggested to the patient also different medications and pharmacological interventions. Patient is keen to try stopping on its own at this time. He will get back to me if he needs any further assistance in this matter. Paroxysmal atrial flutter: Now patient is in atrial flutter with elevated ventricular rate.  She tells me that she in the past 3 to 4 weeks she has not missed a single dose of anticoagulation.  I discussed cardioversion, benefits and potential risks and she is very keen on it.  We will set it up for next week.  Uninterrupted anticoagulation was further emphasized. Mixed dyslipidemia: On lipid-lowering therapy.  Lipids followed by primary care. Essential hypertension: Blood pressure stable and diet was emphasized. Patient will be seen in follow-up appointment in 6 weeks or earlier if the patient has any concerns    Medication Adjustments/Labs and Tests Ordered: Current medicines are reviewed at length with the patient today.  Concerns regarding medicines are outlined above.  No orders of the defined types were placed in this encounter.  No orders of the defined types were placed in this encounter.    History of Present Illness:    Donna Waters is a 66 y.o. female  who is being seen today for the evaluation of atrial flutter and stroke at the request of Paulina Fusi, MD. patient is a pleasant 66 year old female.  She has past medical history of recently diagnosed stroke from which she seems to have recovered well, essential hypertension, mixed dyslipidemia and atrial flutter.  She denies any problems at this time.  She leads a sedentary lifestyle.  Unfortunately she is a heavy smoker since young age.  At the time of my evaluation, the patient is alert awake oriented and in no distress.  Past Medical History:  Diagnosis Date   Abnormal EKG 01/15/2018   Acute ischemic stroke [facility])    Anxiety    Atrial fibrillation (HCC)    Atrial flutter with rapid ventricular response (HCC)    Cerebellar infarct (HCC)    Cervical radiculopathy    Chronic GERD    COPD, moderate (HCC)    COVID-19    Current smoker 01/15/2018   Essential hypertension 01/15/2018   History of cervical cancer    Hypertension    Hypertension, essential, benign    Hypertensive urgency    Ischemia 03/01/2018   Nicotine dependence, unspecified, uncomplicated    Scoliosis of lumbar region due to degenerative disease of spine in adult    Severe protein-calorie malnutrition (HCC)    Tobacco abuse    Unspecified osteoarthritis, unspecified site    Vertigo    Vertigo of central origin     Past Surgical History:  Procedure Laterality Date   LEFT  HEART CATH AND CORONARY ANGIOGRAPHY N/A 03/07/2018   Procedure: LEFT HEART CATH AND CORONARY ANGIOGRAPHY;  Surgeon: Sherren Mocha, MD;  Location: [facility];  Service: Cardiovascular;  Laterality: N/A;   TOTAL VAGINAL HYSTERECTOMY  10/19/2018   TUBAL LIGATION      Current Medications: Current Meds  Medication Sig   albuterol (VENTOLIN HFA) 108 (90 Base) MCG/ACT inhaler Inhale 2 puffs into the lungs 4 (four) times daily.   atorvastatin (LIPITOR) 40 MG tablet Take 40 mg by mouth at bedtime.   Budeson-Glycopyrrol-Formoterol 160-9-4.8  MCG/ACT AERO Inhale 2 puffs into the lungs 2 (two) times daily.   carvedilol (COREG) 12.5 MG tablet Take 12.5 mg by mouth 2 (two) times daily.   diltiazem (CARDIZEM CD) 180 MG 24 hr capsule Take 180 mg by mouth daily.   ELIQUIS 5 MG TABS tablet Take 5 mg by mouth 2 (two) times daily.   etodolac (LODINE) 400 MG tablet Take 400 mg by mouth 2 (two) times daily.   FLUoxetine (PROZAC) 10 MG capsule Take 10 mg by mouth daily.   ondansetron (ZOFRAN-ODT) 4 MG disintegrating tablet Take 4 mg by mouth every 6 (six) hours as needed for nausea/vomiting.     Allergies:   Lisinopril   Social History   Socioeconomic History   Marital status: Unknown    Spouse name: Not on file   Number of children: Not on file   Years of education: Not on file   Highest education level: Not on file  Occupational History   Not on file  Tobacco Use   Smoking status: Every Day    Packs/day: 1.00    Types: Cigarettes   Smokeless tobacco: Never  Vaping Use   Vaping Use: Never used  Substance and Sexual Activity   Alcohol use: Yes    Comment: 6 pack weekly   Drug use: Not Currently   Sexual activity: Not on file  Other Topics Concern   Not on file  Social History Narrative   Not on file   Social Determinants of Health   Financial Resource Strain: Not on file  Food Insecurity: Not on file  Transportation Needs: Not on file  Physical Activity: Not on file  Stress: Not on file  Social Connections: Not on file     Family History: The patient's family history includes Arthritis in her father and mother; COPD in her mother; Depression in her mother; Diabetes in her mother; Heart disease in her mother; Hyperlipidemia in her mother; Hypertension in her father and mother; Stroke in her father and sister; Thyroid disease in her mother.  ROS:   Please see the history of present illness.    All other systems reviewed and are negative.  EKGs/Labs/Other Studies Reviewed:    The following studies were reviewed  today: EKG reveals atrial fibrillation with elevated ventricular rate.   Recent Labs: No results found for requested labs within last 365 days.  Recent Lipid Panel    Component Value Date/Time   CHOL 142 03/17/2018 0928   TRIG 47 03/17/2018 0928   HDL 79 03/17/2018 0928   CHOLHDL 1.8 03/17/2018 0928   LDLCALC 54 03/17/2018 0928    Physical Exam:    VS:  BP (!) 168/82   Pulse 88   Ht 5\' 8"  (1.727 m)   Wt 114 lb 3.2 oz (51.8 kg)   SpO2 97%   BMI 17.36 kg/m     Wt Readings from Last 3 Encounters:  08/06/22 114 lb 3.2 oz (  51.8 kg)  03/07/18 128 lb (58.1 kg)  03/01/18 128 lb 12.8 oz (58.4 kg)     GEN: Patient is in no acute distress HEENT: Normal NECK: No JVD; No carotid bruits LYMPHATICS: No lymphadenopathy CARDIAC: S1 S2 regular, 2/6 systolic murmur at the apex. RESPIRATORY:  Clear to auscultation without rales, wheezing or rhonchi  ABDOMEN: Soft, non-tender, non-distended MUSCULOSKELETAL:  No edema; No deformity  SKIN: Warm and dry NEUROLOGIC:  Alert and oriented x 3 PSYCHIATRIC:  Normal affect    Signed, Garwin Brothers, MD  08/06/2022 2:01 PM    [facility]

## 2022-08-06 NOTE — Patient Instructions (Addendum)
Medication Instructions:  Your physician recommends that you continue on your current medications as directed. Please refer to the Current Medication list given to you today.  *If you need a refill on your cardiac medications before your next appointment, please call your pharmacy*   Lab Work: Your physician recommends that you have a BMET and CBC today in the office.  If you have labs (blood work) drawn today and your tests are completely normal, you will receive your results only by: MyChart Message (if you have MyChart) OR A paper copy in the mail If you have any lab test that is abnormal or we need to change your treatment, we will call you to review the results.   Testing/Procedures: Your physician has recommended that you have a Cardioversion (DCCV). Electrical Cardioversion uses a jolt of electricity to your heart either through paddles or wired patches attached to your chest. This is a controlled, usually prescheduled, procedure. Defibrillation is done under light anesthesia in the hospital, and you usually go home the day of the procedure. This is done to get your heart back into a normal rhythm. You are not awake for the procedure.   Diagnosis: atrial flutter  DATE AND TIME OF PROCEDURE: 08/12/22 Time to be determined  Please register at the Admitting Department at: 08/12/22 [facility] will call you on 08/11/22 with arrival time.  DO NOT EAT OR DRINK ANYTHING after midnight prior to your procedure.  You should take your medications as usual with a sip of water.  If you are diabetic, DO NOT TAKE YOUR DIABETIC MEDICATIONS the morning OF THE PROCEDURE.   DO NOT STOP or miss any doses of your  Eliquis blood thinners that you are taking.  If you miss a dose of this medication let us know as soon as possible as we will need to reschedule your procedure. Missing a dose of the blood thinner and continuing with the cardioversion places you at greater risk for having a  stroke.  Have your blood drawn as intructed.  You will need someone with you to drive you home after the procedure.   Your physician has requested that you have an abdominal aorta duplex. During this test, an ultrasound is used to evaluate the aorta. Allow 30 minutes for this exam. Do not eat after midnight the day before and avoid carbonated beverages     Follow-Up: At [facility], you and your health needs are our priority.  As part of our continuing mission to provide you with exceptional heart care, we have created designated Provider Care Teams.  These Care Teams include your primary Cardiologist (physician) and Advanced Practice Providers (APPs -  Physician Assistants and Nurse Practitioners) who all work together to provide you with the care you need, when you need it.  We recommend signing up for the patient portal called "MyChart".  Sign up information is provided on this After Visit Summary.  MyChart is used to connect with patients for Virtual Visits (Telemedicine).  Patients are able to view lab/test results, encounter notes, upcoming appointments, etc.  Non-urgent messages can be sent to your provider as well.   To learn more about what you can do with MyChart, go to [email or web address].    Your next appointment:   1 month(s)  The format for your next appointment:   In Person  Provider:   Belva Crome, MD   Other Instructions  Electrical Cardioversion Electrical cardioversion is the delivery of a jolt of electricity to restore a normal  rhythm to the heart. A rhythm that is too fast or is not regular keeps the heart from pumping well. In this procedure, sticky patches or metal paddles are placed on the chest to deliver electricity to the heart from a device. This procedure may be done in an emergency if: There is low or no blood pressure as a result of the heart rhythm. Normal rhythm must be restored as fast as possible to protect the brain and heart from further  damage. It may save a life. This may also be a scheduled procedure for irregular or fast heart rhythms that are not immediately life-threatening. Tell a health care provider about: Any allergies you have. All medicines you are taking, including vitamins, herbs, eye drops, creams, and over-the-counter medicines. Any problems you or family members have had with anesthetic medicines. Any blood disorders you have. Any surgeries you have had. Any medical conditions you have. Whether you are pregnant or may be pregnant. What are the risks? Generally, this is a safe procedure. However, problems may occur, including: Allergic reactions to medicines. A blood clot that breaks free and travels to other parts of your body. The possible return of an abnormal heart rhythm within hours or days after the procedure. Your heart stopping (cardiac arrest). This is rare. What happens before the procedure? Medicines Your health care provider may have you start taking: Blood-thinning medicines (anticoagulants) so your blood does not clot as easily. Medicines to help stabilize your heart rate and rhythm. Ask your health care provider about: Changing or stopping your regular medicines. This is especially important if you are taking diabetes medicines or blood thinners. Taking medicines such as aspirin and ibuprofen. These medicines can thin your blood. Do not take these medicines unless your health care provider tells you to take them. Taking over-the-counter medicines, vitamins, herbs, and supplements. General instructions Follow instructions from your health care provider about eating or drinking restrictions. Plan to have someone take you home from the hospital or clinic. If you will be going home right after the procedure, plan to have someone with you for 24 hours. Ask your health care provider what steps will be taken to help prevent infection. These may include washing your skin with a germ-killing  soap. What happens during the procedure? An IV will be inserted into one of your veins. Sticky patches (electrodes) or metal paddles may be placed on your chest. You will be given a medicine to help you relax (sedative). An electrical shock will be delivered. The procedure may vary among health care providers and hospitals.    What can I expect after the procedure? Your blood pressure, heart rate, breathing rate, and blood oxygen level will be monitored until you leave the hospital or clinic. Your heart rhythm will be watched to make sure it does not change. You may have some redness on the skin where the shocks were given. Follow these instructions at home: Do not drive for 24 hours if you were given a sedative during your procedure. Take over-the-counter and prescription medicines only as told by your health care provider. Ask your health care provider how to check your pulse. Check it often. Rest for 48 hours after the procedure or as told by your health care provider. Avoid or limit your caffeine use as told by your health care provider. Keep all follow-up visits as told by your health care provider. This is important. Contact a health care provider if: You feel like your heart is beating too quickly  or your pulse is not regular. You have a serious muscle cramp that does not go away. Get help right away if: You have discomfort in your chest. You are dizzy or you feel faint. You have trouble breathing or you are short of breath. Your speech is slurred. You have trouble moving an arm or leg on one side of your body. Your fingers or toes turn cold or blue. Summary Electrical cardioversion is the delivery of a jolt of electricity to restore a normal rhythm to the heart. This procedure may be done right away in an emergency or may be a scheduled procedure if the condition is not an emergency. Generally, this is a safe procedure. After the procedure, check your pulse often as told by  your health care provider. This information is not intended to replace advice given to you by your health care provider. Make sure you discuss any questions you have with your health care provider. Document Revised: 05/21/2019 Document Reviewed: 05/21/2019 Elsevier Patient Education  [place].

## 2022-08-06 NOTE — Addendum Note (Signed)
Addended by: Truddie Hidden on: 08/06/2022 02:16 PM   Modules accepted: Orders

## 2022-08-07 LAB — BASIC METABOLIC PANEL
BUN/Creatinine Ratio: 15 (ref 12–28)
BUN: 11 mg/dL (ref 8–27)
CO2: 22 mmol/L (ref 20–29)
Calcium: 9.9 mg/dL (ref 8.7–10.3)
Chloride: 94 mmol/L — ABNORMAL LOW (ref 96–106)
Creatinine, Ser: 0.75 mg/dL (ref 0.57–1.00)
Glucose: 93 mg/dL (ref 70–99)
Potassium: 5.2 mmol/L (ref 3.5–5.2)
Sodium: 134 mmol/L (ref 134–144)
eGFR: 89 mL/min/{1.73_m2} (ref >59)

## 2022-08-07 LAB — CBC
Hematocrit: 43 % (ref 34.0–46.6)
Hemoglobin: 15.6 g/dL (ref 11.1–15.9)
MCH: 34.7 pg — ABNORMAL HIGH (ref 26.6–33.0)
MCHC: 36.3 g/dL — ABNORMAL HIGH (ref 31.5–35.7)
MCV: 96 fL (ref 79–97)
Platelets: 382 10*3/uL (ref 150–450)
RBC: 4.49 x10E6/uL (ref 3.77–5.28)
RDW: 11.8 % (ref 11.7–15.4)
WBC: 8.6 10*3/uL (ref 3.4–10.8)

## 2022-08-09 MED ORDER — ELIQUIS 5 MG PO TABS: 5.0000 mg | ORAL_TABLET | Freq: Two times a day (BID) | ORAL | 12 refills | Status: DC

## 2022-08-09 NOTE — Addendum Note (Signed)
Addended by: Truddie Hidden on: 08/09/2022 02:06 PM   Modules accepted: Orders

## 2022-08-10 ENCOUNTER — Ambulatory Visit: Payer: BC Managed Care – PPO | Attending: Cardiology

## 2022-08-10 DIAGNOSIS — Z8673 Personal history of transient ischemic attack (TIA), and cerebral infarction without residual deficits: Secondary | ICD-10-CM | POA: Diagnosis not present

## 2022-08-10 DIAGNOSIS — F17200 Nicotine dependence, unspecified, uncomplicated: Secondary | ICD-10-CM

## 2022-08-12 DIAGNOSIS — Z8673 Personal history of transient ischemic attack (TIA), and cerebral infarction without residual deficits: Secondary | ICD-10-CM | POA: Diagnosis not present

## 2022-08-12 DIAGNOSIS — I4891 Unspecified atrial fibrillation: Secondary | ICD-10-CM | POA: Diagnosis not present

## 2022-08-12 DIAGNOSIS — F17210 Nicotine dependence, cigarettes, uncomplicated: Secondary | ICD-10-CM | POA: Diagnosis not present

## 2022-08-12 DIAGNOSIS — I480 Paroxysmal atrial fibrillation: Secondary | ICD-10-CM | POA: Diagnosis not present

## 2022-08-12 DIAGNOSIS — Z7901 Long term (current) use of anticoagulants: Secondary | ICD-10-CM | POA: Diagnosis not present

## 2022-08-12 DIAGNOSIS — E782 Mixed hyperlipidemia: Secondary | ICD-10-CM | POA: Diagnosis not present

## 2022-08-12 DIAGNOSIS — I4892 Unspecified atrial flutter: Secondary | ICD-10-CM | POA: Diagnosis not present

## 2022-08-12 DIAGNOSIS — Z79899 Other long term (current) drug therapy: Secondary | ICD-10-CM | POA: Diagnosis not present

## 2022-08-12 DIAGNOSIS — I10 Essential (primary) hypertension: Secondary | ICD-10-CM | POA: Diagnosis not present

## 2022-08-16 DIAGNOSIS — J449 Chronic obstructive pulmonary disease, unspecified: Secondary | ICD-10-CM | POA: Diagnosis not present

## 2022-08-16 DIAGNOSIS — I480 Paroxysmal atrial fibrillation: Secondary | ICD-10-CM | POA: Diagnosis not present

## 2022-08-16 DIAGNOSIS — Z79899 Other long term (current) drug therapy: Secondary | ICD-10-CM | POA: Diagnosis not present

## 2022-08-16 DIAGNOSIS — Z23 Encounter for immunization: Secondary | ICD-10-CM | POA: Diagnosis not present

## 2022-08-16 DIAGNOSIS — I10 Essential (primary) hypertension: Secondary | ICD-10-CM | POA: Diagnosis not present

## 2022-08-16 DIAGNOSIS — F419 Anxiety disorder, unspecified: Secondary | ICD-10-CM | POA: Diagnosis not present

## 2022-08-16 DIAGNOSIS — E785 Hyperlipidemia, unspecified: Secondary | ICD-10-CM | POA: Diagnosis not present

## 2022-09-03 DIAGNOSIS — F419 Anxiety disorder, unspecified: Secondary | ICD-10-CM | POA: Diagnosis not present

## 2022-09-03 DIAGNOSIS — I10 Essential (primary) hypertension: Secondary | ICD-10-CM | POA: Diagnosis not present

## 2022-09-10 ENCOUNTER — Encounter: Payer: Self-pay | Admitting: Cardiology

## 2022-09-10 ENCOUNTER — Ambulatory Visit: Payer: BC Managed Care – PPO | Attending: Cardiology | Admitting: Cardiology

## 2022-09-10 VITALS — BP 134/82 | HR 80 | Ht 68.0 in | Wt 118.4 lb

## 2022-09-10 DIAGNOSIS — Z720 Tobacco use: Secondary | ICD-10-CM

## 2022-09-10 DIAGNOSIS — E782 Mixed hyperlipidemia: Secondary | ICD-10-CM

## 2022-09-10 DIAGNOSIS — I4892 Unspecified atrial flutter: Secondary | ICD-10-CM | POA: Diagnosis not present

## 2022-09-10 DIAGNOSIS — I10 Essential (primary) hypertension: Secondary | ICD-10-CM

## 2022-09-10 DIAGNOSIS — I4891 Unspecified atrial fibrillation: Secondary | ICD-10-CM

## 2022-09-10 DIAGNOSIS — F17210 Nicotine dependence, cigarettes, uncomplicated: Secondary | ICD-10-CM | POA: Diagnosis not present

## 2022-09-10 NOTE — Patient Instructions (Signed)

## 2022-09-10 NOTE — Progress Notes (Signed)
Cardiology Office Note:    Date:  09/10/2022   ID:  [name], DOB 01/17/1956, MRN [id number]  PCP:  Paulina Fusi, MD  Cardiologist:  Garwin Brothers, MD   Referring MD: Paulina Fusi, MD    ASSESSMENT:    1. Atrial fibrillation, unspecified type (HCC)   2. Atrial flutter with rapid ventricular response (HCC)   3. Essential hypertension   4. Mixed dyslipidemia   5. Tobacco abuse    PLAN:    In order of problems listed above:  Primary prevention stressed with the patient.  Importance of compliance with diet medication stressed and she vocalized understanding.  She was advised to walk at least half an hour a day on a daily basis.  She promises to do better. Essential hypertension: Blood pressure stable and diet was emphasized.  Lifestyle modification urged. Mixed dyslipidemia: Managed by primary care.  Lipids were reviewed.  Diet emphasized. Cigarette smoker: I spent 5 minutes with the patient discussing solely about smoking. Smoking cessation was counseled. I suggested to the patient also different medications and pharmacological interventions. Patient is keen to try stopping on its own at this time. He will get back to me if he needs any further assistance in this matter. Paroxysmal atrial fibrillation and flutter:I discussed with the patient atrial fibrillation, disease process. Management and therapy including rate and rhythm control, anticoagulation benefits and potential risks were discussed extensively with the patient. Patient had multiple questions which were answered to patient's satisfaction. Patient will be seen in follow-up appointment in 6 months or earlier if the patient has any concerns    Medication Adjustments/Labs and Tests Ordered: Current medicines are reviewed at length with the patient today.  Concerns regarding medicines are outlined above.  No orders of the defined types were placed in this encounter.  No orders of the defined types were placed  in this encounter.    No chief complaint on file.    History of Present Illness:    Donna Waters is a 66 y.o. female.  Patient has past medical history of essential hypertension, history of stroke, paroxysmal atrial flutter and fibrillation and unfortunately continues to smoke.  She has history of mixed dyslipidemia.  She denies any problems at this time and takes care of activities of daily living.  No chest pain orthopnea or PND.  At the time of my evaluation, the patient is alert awake oriented and in no distress.  Past Medical History:  Diagnosis Date   Abnormal EKG 01/15/2018   Acute ischemic stroke [facility])    Anxiety    Atrial fibrillation (HCC)    Atrial flutter with rapid ventricular response (HCC)    Cerebellar infarct (HCC)    Cervical radiculopathy    Chronic GERD    COPD, moderate (HCC)    COVID-19    Current smoker 01/15/2018   Essential hypertension 01/15/2018   History of cervical cancer    Hypertension    Hypertension, essential, benign    Hypertensive urgency    Ischemia 03/01/2018   Mixed dyslipidemia 08/06/2022   Nicotine dependence, unspecified, uncomplicated    Scoliosis of lumbar region due to degenerative disease of spine in adult    Severe protein-calorie malnutrition (HCC)    Tobacco abuse    Unspecified osteoarthritis, unspecified site    Vertigo    Vertigo of central origin     Past Surgical History:  Procedure Laterality Date   LEFT HEART CATH AND CORONARY ANGIOGRAPHY N/A 03/07/2018   Procedure:  LEFT HEART CATH AND CORONARY ANGIOGRAPHY;  Surgeon: Tonny Bollman, MD;  Location: [facility] INVASIVE CV LAB;  Service: Cardiovascular;  Laterality: N/A;   TOTAL VAGINAL HYSTERECTOMY  10/19/2018   TUBAL LIGATION      Current Medications: Current Meds  Medication Sig   albuterol (VENTOLIN HFA) 108 (90 Base) MCG/ACT inhaler Inhale 2 puffs into the lungs 4 (four) times daily.   atorvastatin (LIPITOR) 40 MG tablet Take 40 mg by mouth at bedtime.    Budeson-Glycopyrrol-Formoterol 160-9-4.8 MCG/ACT AERO Inhale 2 puffs into the lungs 2 (two) times daily.   carvedilol (COREG) 12.5 MG tablet Take 12.5 mg by mouth 2 (two) times daily.   ELIQUIS 5 MG TABS tablet Take 1 tablet (5 mg total) by mouth 2 (two) times daily.   etodolac (LODINE) 400 MG tablet Take 400 mg by mouth 2 (two) times daily.   FLUoxetine (PROZAC) 10 MG capsule Take 10 mg by mouth daily.   nicotine (NICODERM CQ - DOSED IN MG/24 HOURS) 21 mg/24hr patch Place 21 mg onto the skin daily.   ondansetron (ZOFRAN-ODT) 4 MG disintegrating tablet Take 4 mg by mouth every 6 (six) hours as needed for nausea/vomiting.   [DISCONTINUED] diltiazem (CARDIZEM CD) 180 MG 24 hr capsule Take 180 mg by mouth daily.     Allergies:   Lisinopril   Social History   Socioeconomic History   Marital status: Unknown    Spouse name: Not on file   Number of children: Not on file   Years of education: Not on file   Highest education level: Not on file  Occupational History   Not on file  Tobacco Use   Smoking status: Every Day    Packs/day: 1.00    Types: Cigarettes   Smokeless tobacco: Never  Vaping Use   Vaping Use: Never used  Substance and Sexual Activity   Alcohol use: Yes    Comment: 6 pack weekly   Drug use: Not Currently   Sexual activity: Not on file  Other Topics Concern   Not on file  Social History Narrative   Not on file   Social Determinants of Health   Financial Resource Strain: Not on file  Food Insecurity: Not on file  Transportation Needs: Not on file  Physical Activity: Not on file  Stress: Not on file  Social Connections: Not on file     Family History: The patient's family history includes Arthritis in her father and mother; COPD in her mother; Depression in her mother; Diabetes in her mother; Heart disease in her mother; Hyperlipidemia in her mother; Hypertension in her father and mother; Stroke in her father and sister; Thyroid disease in her mother.  ROS:    Please see the history of present illness.    All other systems reviewed and are negative.  EKGs/Labs/Other Studies Reviewed:    The following studies were reviewed today: I discussed my findings with the patient at length.   Recent Labs: 08/06/2022: BUN 11; Creatinine, Ser 0.75; Hemoglobin 15.6; Platelets 382; Potassium 5.2; Sodium 134  Recent Lipid Panel    Component Value Date/Time   CHOL 142 03/17/2018 0928   TRIG 47 03/17/2018 0928   HDL 79 03/17/2018 0928   CHOLHDL 1.8 03/17/2018 0928   LDLCALC 54 03/17/2018 0928    Physical Exam:    VS:  BP 134/82   Pulse 80   Ht 5\' 8"  (1.727 m)   Wt 118 lb 6.4 oz (53.7 kg)   SpO2 94%   BMI  18.00 kg/m     Wt Readings from Last 3 Encounters:  09/10/22 118 lb 6.4 oz (53.7 kg)  08/06/22 114 lb 3.2 oz (51.8 kg)  03/07/18 128 lb (58.1 kg)     GEN: Patient is in no acute distress HEENT: Normal NECK: No JVD; No carotid bruits LYMPHATICS: No lymphadenopathy CARDIAC: Hear sounds regular, 2/6 systolic murmur at the apex. RESPIRATORY:  Clear to auscultation without rales, wheezing or rhonchi  ABDOMEN: Soft, non-tender, non-distended MUSCULOSKELETAL:  No edema; No deformity  SKIN: Warm and dry NEUROLOGIC:  Alert and oriented x 3 PSYCHIATRIC:  Normal affect   Signed, Garwin Brothers, MD  09/10/2022 3:46 PM    [facility]

## 2022-09-13 DIAGNOSIS — H9201 Otalgia, right ear: Secondary | ICD-10-CM | POA: Diagnosis not present

## 2022-09-13 DIAGNOSIS — J440 Chronic obstructive pulmonary disease with acute lower respiratory infection: Secondary | ICD-10-CM | POA: Diagnosis not present

## 2022-09-13 DIAGNOSIS — H6121 Impacted cerumen, right ear: Secondary | ICD-10-CM | POA: Diagnosis not present

## 2022-09-13 DIAGNOSIS — J209 Acute bronchitis, unspecified: Secondary | ICD-10-CM | POA: Diagnosis not present

## 2022-09-14 ENCOUNTER — Telehealth: Payer: Self-pay | Admitting: Cardiology

## 2022-09-14 NOTE — Telephone Encounter (Signed)
Patient had her heart shock a couple of weeks ago. And someone else has theirs as well, and that person was told not to eat chocolate. Patients wants to know if she would do the same. Please advise

## 2022-09-16 NOTE — Telephone Encounter (Signed)
Left vm to call back

## 2022-09-17 NOTE — Telephone Encounter (Signed)
Left a VM to call back

## 2022-09-20 ENCOUNTER — Telehealth: Payer: Self-pay | Admitting: Cardiology

## 2022-09-20 NOTE — Telephone Encounter (Signed)
Left VM to call back 

## 2022-09-20 NOTE — Telephone Encounter (Signed)
Recommendations reviewed with pt as per Dr. Kem Parkinson note.  Pt verbalized understanding and had no additional questions.    Can do some in moderation. If she is reluctant she can drop by them at our office.  :)

## 2022-09-20 NOTE — Telephone Encounter (Signed)
Pt is returning call in regards to results. Transferred to Eleonore Chiquito, RN

## 2022-11-04 DIAGNOSIS — M4156 Other secondary scoliosis, lumbar region: Secondary | ICD-10-CM | POA: Diagnosis not present

## 2022-11-04 DIAGNOSIS — G8929 Other chronic pain: Secondary | ICD-10-CM | POA: Diagnosis not present

## 2022-11-04 DIAGNOSIS — M546 Pain in thoracic spine: Secondary | ICD-10-CM | POA: Diagnosis not present

## 2022-11-04 DIAGNOSIS — M545 Low back pain, unspecified: Secondary | ICD-10-CM | POA: Diagnosis not present

## 2022-11-05 DIAGNOSIS — M4126 Other idiopathic scoliosis, lumbar region: Secondary | ICD-10-CM | POA: Diagnosis not present

## 2022-11-05 DIAGNOSIS — M545 Low back pain, unspecified: Secondary | ICD-10-CM | POA: Diagnosis not present

## 2022-11-05 DIAGNOSIS — M546 Pain in thoracic spine: Secondary | ICD-10-CM | POA: Diagnosis not present

## 2022-11-15 DIAGNOSIS — J209 Acute bronchitis, unspecified: Secondary | ICD-10-CM | POA: Diagnosis not present

## 2022-11-15 DIAGNOSIS — J440 Chronic obstructive pulmonary disease with acute lower respiratory infection: Secondary | ICD-10-CM | POA: Diagnosis not present

## 2022-12-08 DIAGNOSIS — I872 Venous insufficiency (chronic) (peripheral): Secondary | ICD-10-CM | POA: Diagnosis not present

## 2022-12-08 DIAGNOSIS — Z1231 Encounter for screening mammogram for malignant neoplasm of breast: Secondary | ICD-10-CM | POA: Diagnosis not present

## 2022-12-08 DIAGNOSIS — I480 Paroxysmal atrial fibrillation: Secondary | ICD-10-CM | POA: Diagnosis not present

## 2022-12-08 DIAGNOSIS — Z79899 Other long term (current) drug therapy: Secondary | ICD-10-CM | POA: Diagnosis not present

## 2022-12-08 DIAGNOSIS — Z720 Tobacco use: Secondary | ICD-10-CM | POA: Diagnosis not present

## 2022-12-08 DIAGNOSIS — I10 Essential (primary) hypertension: Secondary | ICD-10-CM | POA: Diagnosis not present

## 2022-12-08 DIAGNOSIS — J449 Chronic obstructive pulmonary disease, unspecified: Secondary | ICD-10-CM | POA: Diagnosis not present

## 2022-12-08 DIAGNOSIS — E785 Hyperlipidemia, unspecified: Secondary | ICD-10-CM | POA: Diagnosis not present

## 2023-01-24 DIAGNOSIS — I480 Paroxysmal atrial fibrillation: Secondary | ICD-10-CM | POA: Diagnosis not present

## 2023-01-24 DIAGNOSIS — I872 Venous insufficiency (chronic) (peripheral): Secondary | ICD-10-CM | POA: Diagnosis not present

## 2023-01-24 DIAGNOSIS — Z720 Tobacco use: Secondary | ICD-10-CM | POA: Diagnosis not present

## 2023-01-24 DIAGNOSIS — I10 Essential (primary) hypertension: Secondary | ICD-10-CM | POA: Diagnosis not present

## 2023-01-24 DIAGNOSIS — J449 Chronic obstructive pulmonary disease, unspecified: Secondary | ICD-10-CM | POA: Diagnosis not present

## 2023-03-09 DIAGNOSIS — Z1231 Encounter for screening mammogram for malignant neoplasm of breast: Secondary | ICD-10-CM | POA: Diagnosis not present

## 2023-03-09 DIAGNOSIS — I10 Essential (primary) hypertension: Secondary | ICD-10-CM | POA: Diagnosis not present

## 2023-03-09 DIAGNOSIS — E785 Hyperlipidemia, unspecified: Secondary | ICD-10-CM | POA: Diagnosis not present

## 2023-03-09 DIAGNOSIS — I480 Paroxysmal atrial fibrillation: Secondary | ICD-10-CM | POA: Diagnosis not present

## 2023-03-09 DIAGNOSIS — Z681 Body mass index (BMI) 19 or less, adult: Secondary | ICD-10-CM | POA: Diagnosis not present

## 2023-03-09 DIAGNOSIS — I639 Cerebral infarction, unspecified: Secondary | ICD-10-CM | POA: Diagnosis not present

## 2023-03-09 DIAGNOSIS — J449 Chronic obstructive pulmonary disease, unspecified: Secondary | ICD-10-CM | POA: Diagnosis not present

## 2023-03-09 DIAGNOSIS — I872 Venous insufficiency (chronic) (peripheral): Secondary | ICD-10-CM | POA: Diagnosis not present

## 2023-03-09 DIAGNOSIS — Z720 Tobacco use: Secondary | ICD-10-CM | POA: Diagnosis not present

## 2023-03-09 DIAGNOSIS — Z79899 Other long term (current) drug therapy: Secondary | ICD-10-CM | POA: Diagnosis not present

## 2023-03-09 DIAGNOSIS — F419 Anxiety disorder, unspecified: Secondary | ICD-10-CM | POA: Diagnosis not present

## 2023-03-09 DIAGNOSIS — Z8673 Personal history of transient ischemic attack (TIA), and cerebral infarction without residual deficits: Secondary | ICD-10-CM | POA: Diagnosis not present

## 2023-04-07 DIAGNOSIS — J209 Acute bronchitis, unspecified: Secondary | ICD-10-CM | POA: Diagnosis not present

## 2023-04-07 DIAGNOSIS — J440 Chronic obstructive pulmonary disease with acute lower respiratory infection: Secondary | ICD-10-CM | POA: Diagnosis not present

## 2023-04-07 DIAGNOSIS — M25511 Pain in right shoulder: Secondary | ICD-10-CM | POA: Diagnosis not present

## 2023-04-07 DIAGNOSIS — Z1231 Encounter for screening mammogram for malignant neoplasm of breast: Secondary | ICD-10-CM | POA: Diagnosis not present

## 2023-04-12 DIAGNOSIS — Z1231 Encounter for screening mammogram for malignant neoplasm of breast: Secondary | ICD-10-CM | POA: Diagnosis not present

## 2023-04-21 ENCOUNTER — Other Ambulatory Visit: Payer: Self-pay | Admitting: *Deleted

## 2023-04-21 DIAGNOSIS — M7989 Other specified soft tissue disorders: Secondary | ICD-10-CM

## 2023-04-27 NOTE — Progress Notes (Deleted)
[facility]           [facility] [place]  History and Physical   Donna Waters is a 67 y.o. female who presents with ***  She was seen by Dr. Myra Gianotti in 2019 for discoloration of her feet.  She was not having claudication.  She would get some tingling in her feet at night.  She had sciatic back pain.  Her ABI's were normal.    She has hx of afib, ischemic stroke, HTN, HLD, tobacco use.  She did have a AAA duplex last year that was negative for AAA.  The pt does *** have hx of previous venous procedures. The patient has *** history of DVT. Pt does *** history of varicose vein.   Pt does *** history of skin changes in lower legs.   There is *** family history of venous disorders.   The patient has *** used compression stockings in the past.    The pt is on a statin for cholesterol management.  The pt is not on a daily aspirin.   Other AC:  eliquis The pt is on BB for hypertension.   The pt is not on medication for diabetes.   Tobacco hx:  current  Pt does *** have family hx of AAA.  Past Medical History:  Diagnosis Date   Abnormal EKG 01/15/2018   Acute ischemic stroke [facility])    Anxiety    Atrial fibrillation (HCC)    Atrial flutter with rapid ventricular response (HCC)    Cerebellar infarct (HCC)    Cervical radiculopathy    Chronic GERD    COPD, moderate (HCC)    COVID-19    Current smoker 01/15/2018   Essential hypertension 01/15/2018   History of cervical cancer    Hypertension    Hypertension, essential, benign    Hypertensive urgency    Ischemia 03/01/2018   Mixed dyslipidemia 08/06/2022   Nicotine dependence, unspecified, uncomplicated    Scoliosis of lumbar region due to degenerative disease of spine in adult    Severe protein-calorie malnutrition (HCC)    Tobacco abuse    Unspecified osteoarthritis, unspecified site    Vertigo    Vertigo of central origin     Past Surgical History:  Procedure Laterality Date   LEFT HEART CATH AND  CORONARY ANGIOGRAPHY N/A 03/07/2018   Procedure: LEFT HEART CATH AND CORONARY ANGIOGRAPHY;  Surgeon: Tonny Bollman, MD;  Location: [facility] INVASIVE CV LAB;  Service: Cardiovascular;  Laterality: N/A;   TOTAL VAGINAL HYSTERECTOMY  10/19/2018   TUBAL LIGATION      Social History   Socioeconomic History   Marital status: Unknown    Spouse name: Not on file   Number of children: Not on file   Years of education: Not on file   Highest education level: Not on file  Occupational History   Not on file  Tobacco Use   Smoking status: Every Day    Packs/day: 1    Types: Cigarettes   Smokeless tobacco: Never  Vaping Use   Vaping Use: Never used  Substance and Sexual Activity   Alcohol use: Yes    Comment: 6 pack weekly   Drug use: Not Currently   Sexual activity: Not on file  Other Topics Concern   Not on file  Social History Narrative   Not on file   Social Determinants of Health   Financial Resource Strain: Not on file  Food Insecurity: Not on  file  Transportation Needs: Not on file  Physical Activity: Not on file  Stress: Not on file  Social Connections: Not on file  Intimate Partner Violence: Not on file    *** Family History  Problem Relation Age of Onset   Arthritis Mother    COPD Mother    Depression Mother    Diabetes Mother    Heart disease Mother    Hypertension Mother    Hyperlipidemia Mother    Thyroid disease Mother    Arthritis Father    Hypertension Father    Stroke Father    Stroke Sister     Current Outpatient Medications  Medication Sig Dispense Refill   albuterol (VENTOLIN HFA) 108 (90 Base) MCG/ACT inhaler Inhale 2 puffs into the lungs 4 (four) times daily.     atorvastatin (LIPITOR) 40 MG tablet Take 40 mg by mouth at bedtime.     Budeson-Glycopyrrol-Formoterol 160-9-4.8 MCG/ACT AERO Inhale 2 puffs into the lungs 2 (two) times daily.     carvedilol (COREG) 12.5 MG tablet Take 12.5 mg by mouth 2 (two) times daily.     ELIQUIS 5 MG TABS tablet  Take 1 tablet (5 mg total) by mouth 2 (two) times daily. 60 tablet 12   etodolac (LODINE) 400 MG tablet Take 400 mg by mouth 2 (two) times daily.     FLUoxetine (PROZAC) 10 MG capsule Take 10 mg by mouth daily.     nicotine (NICODERM CQ - DOSED IN MG/24 HOURS) 21 mg/24hr patch Place 21 mg onto the skin daily.     ondansetron (ZOFRAN-ODT) 4 MG disintegrating tablet Take 4 mg by mouth every 6 (six) hours as needed for nausea/vomiting.     No current facility-administered medications for this visit.    Allergies  Allergen Reactions   Lisinopril Other (See Comments)    Dropped blood pressure, but made heart race    REVIEW OF SYSTEMS:  *** [X]  denotes positive finding, [ ]  denotes negative finding Cardiac  Comments:  Chest pain or chest pressure:    Shortness of breath upon exertion:    Short of breath when lying flat:    Irregular heart rhythm:        Vascular    Pain in calf, thigh, or hip brought on by ambulation:    Pain in feet at night that wakes you up from your sleep:     Blood clot in your veins:    Leg swelling:  x       Pulmonary    Oxygen at home:    Productive cough:     Wheezing:         Neurologic    Sudden weakness in arms or legs:     Sudden numbness in arms or legs:     Sudden onset of difficulty speaking or slurred speech:    Temporary loss of vision in one eye:     Problems with dizziness:         Gastrointestinal    Blood in stool:     Vomited blood:         Genitourinary    Burning when urinating:     Blood in urine:        Psychiatric    Major depression:         Hematologic    Bleeding problems:    Problems with blood clotting too easily:        Skin    Rashes or ulcers:  Constitutional    Fever or chills:      PHYSICAL EXAMINATION:  ***  General:  WDWN in NAD; vital signs documented above Gait: Not observed HENT: WNL, normocephalic Pulmonary: normal non-labored breathing without wheezing Cardiac: {Desc;  regular/irreg:14544} HR; {With/Without:20273} carotid bruit*** Abdomen: soft, NT, aortic pulse is *** palpable Skin: {With/Without:20273} rashes Vascular Exam/Pulses:  Right Left  Radial {Exam; arterial pulse strength 0-4:30167} {Exam; arterial pulse strength 0-4:30167}  DP {Exam; arterial pulse strength 0-4:30167} {Exam; arterial pulse strength 0-4:30167}  PT {Exam; arterial pulse strength 0-4:30167} {Exam; arterial pulse strength 0-4:30167}   Extremities: ***  Neurologic: A&O X 3;  moving all extremities equally Psychiatric:  The pt has {Desc; normal/abnormal:11317::"Normal"} affect.   Non-Invasive Vascular Imaging:   Venous duplex on 05/02/2023: ***    Donna Waters is a 67 y.o. female who presents with: ***    -pt has *** pedal pulses -pt does *** have evidence of DVT.  Pt does ***have venous reflux *** -discussed with pt about wearing *** high *** mmHg compression stockings and pt was measured for these today.   *** -discussed the importance of leg elevation and how to elevate properly - pt is advised to elevate their legs and a diagram is given to them to demonstrate for pt to lay flat on their back with knees elevated and slightly bent with their feet higher than their knees, which puts their feet higher than their heart for 15 minutes per day.  If pt cannot lay flat, advised to lay as flat as possible.  -pt is advised to continue as much walking as possible and avoid sitting or standing for long periods of time.  -discussed importance of weight loss and exercise and that water aerobics would also be beneficial.  -handout with recommendations given -pt will f/u ***   Doreatha Massed, [facility] [phone]  Clinic MD:  Myra Gianotti

## 2023-05-02 ENCOUNTER — Ambulatory Visit (HOSPITAL_COMMUNITY): Payer: Medicare HMO | Attending: Internal Medicine

## 2023-06-10 DIAGNOSIS — I10 Essential (primary) hypertension: Secondary | ICD-10-CM | POA: Diagnosis not present

## 2023-06-10 DIAGNOSIS — Z8673 Personal history of transient ischemic attack (TIA), and cerebral infarction without residual deficits: Secondary | ICD-10-CM | POA: Diagnosis not present

## 2023-06-10 DIAGNOSIS — J449 Chronic obstructive pulmonary disease, unspecified: Secondary | ICD-10-CM | POA: Diagnosis not present

## 2023-06-10 DIAGNOSIS — I480 Paroxysmal atrial fibrillation: Secondary | ICD-10-CM | POA: Diagnosis not present

## 2023-06-10 DIAGNOSIS — F419 Anxiety disorder, unspecified: Secondary | ICD-10-CM | POA: Diagnosis not present

## 2023-06-10 DIAGNOSIS — Z9181 History of falling: Secondary | ICD-10-CM | POA: Diagnosis not present

## 2023-06-10 DIAGNOSIS — E785 Hyperlipidemia, unspecified: Secondary | ICD-10-CM | POA: Diagnosis not present

## 2023-06-10 DIAGNOSIS — Z1331 Encounter for screening for depression: Secondary | ICD-10-CM | POA: Diagnosis not present

## 2023-06-10 DIAGNOSIS — I872 Venous insufficiency (chronic) (peripheral): Secondary | ICD-10-CM | POA: Diagnosis not present

## 2023-06-28 DIAGNOSIS — M7989 Other specified soft tissue disorders: Secondary | ICD-10-CM | POA: Diagnosis not present

## 2023-06-28 DIAGNOSIS — M25511 Pain in right shoulder: Secondary | ICD-10-CM | POA: Diagnosis not present

## 2023-06-28 DIAGNOSIS — H9202 Otalgia, left ear: Secondary | ICD-10-CM | POA: Diagnosis not present

## 2023-06-28 DIAGNOSIS — H6123 Impacted cerumen, bilateral: Secondary | ICD-10-CM | POA: Diagnosis not present

## 2023-06-28 DIAGNOSIS — J449 Chronic obstructive pulmonary disease, unspecified: Secondary | ICD-10-CM | POA: Diagnosis not present

## 2023-07-19 DIAGNOSIS — M19011 Primary osteoarthritis, right shoulder: Secondary | ICD-10-CM | POA: Diagnosis not present

## 2023-09-02 ENCOUNTER — Other Ambulatory Visit: Payer: Self-pay | Admitting: Cardiology

## 2023-09-05 NOTE — Telephone Encounter (Signed)
Prescription refill request for Eliquis received. Indication: AF Last office visit: 09/10/22  R Revankar MD Scr: 0.74 on 06/10/23  [facility] Age: 67 Weight: 53.7kg  Based on above findings Eliquis 5mg  twice daily is the appropriate dose. Refill approved.

## 2023-09-20 DIAGNOSIS — Z8673 Personal history of transient ischemic attack (TIA), and cerebral infarction without residual deficits: Secondary | ICD-10-CM | POA: Diagnosis not present

## 2023-09-20 DIAGNOSIS — M8589 Other specified disorders of bone density and structure, multiple sites: Secondary | ICD-10-CM | POA: Diagnosis not present

## 2023-09-20 DIAGNOSIS — F419 Anxiety disorder, unspecified: Secondary | ICD-10-CM | POA: Diagnosis not present

## 2023-09-20 DIAGNOSIS — I10 Essential (primary) hypertension: Secondary | ICD-10-CM | POA: Diagnosis not present

## 2023-09-20 DIAGNOSIS — I480 Paroxysmal atrial fibrillation: Secondary | ICD-10-CM | POA: Diagnosis not present

## 2023-09-20 DIAGNOSIS — Z720 Tobacco use: Secondary | ICD-10-CM | POA: Diagnosis not present

## 2023-09-20 DIAGNOSIS — Z23 Encounter for immunization: Secondary | ICD-10-CM | POA: Diagnosis not present

## 2023-09-20 DIAGNOSIS — E785 Hyperlipidemia, unspecified: Secondary | ICD-10-CM | POA: Diagnosis not present

## 2023-09-20 DIAGNOSIS — I872 Venous insufficiency (chronic) (peripheral): Secondary | ICD-10-CM | POA: Diagnosis not present

## 2023-09-20 DIAGNOSIS — J449 Chronic obstructive pulmonary disease, unspecified: Secondary | ICD-10-CM | POA: Diagnosis not present

## 2023-10-19 DIAGNOSIS — I75023 Atheroembolism of bilateral lower extremities: Secondary | ICD-10-CM | POA: Diagnosis not present

## 2023-10-19 DIAGNOSIS — R209 Unspecified disturbances of skin sensation: Secondary | ICD-10-CM | POA: Diagnosis not present

## 2023-10-19 DIAGNOSIS — I872 Venous insufficiency (chronic) (peripheral): Secondary | ICD-10-CM | POA: Diagnosis not present

## 2023-11-14 DIAGNOSIS — J208 Acute bronchitis due to other specified organisms: Secondary | ICD-10-CM | POA: Diagnosis not present

## 2023-11-29 DIAGNOSIS — J440 Chronic obstructive pulmonary disease with acute lower respiratory infection: Secondary | ICD-10-CM | POA: Diagnosis not present

## 2023-12-06 DIAGNOSIS — F17200 Nicotine dependence, unspecified, uncomplicated: Secondary | ICD-10-CM | POA: Diagnosis not present

## 2023-12-06 DIAGNOSIS — J440 Chronic obstructive pulmonary disease with acute lower respiratory infection: Secondary | ICD-10-CM | POA: Diagnosis not present

## 2023-12-06 DIAGNOSIS — Z87891 Personal history of nicotine dependence: Secondary | ICD-10-CM | POA: Diagnosis not present

## 2023-12-06 DIAGNOSIS — J449 Chronic obstructive pulmonary disease, unspecified: Secondary | ICD-10-CM | POA: Diagnosis not present

## 2023-12-06 DIAGNOSIS — R1904 Left lower quadrant abdominal swelling, mass and lump: Secondary | ICD-10-CM | POA: Diagnosis not present

## 2023-12-06 DIAGNOSIS — Z720 Tobacco use: Secondary | ICD-10-CM | POA: Diagnosis not present

## 2023-12-09 DIAGNOSIS — R1904 Left lower quadrant abdominal swelling, mass and lump: Secondary | ICD-10-CM | POA: Diagnosis not present

## 2023-12-09 DIAGNOSIS — J90 Pleural effusion, not elsewhere classified: Secondary | ICD-10-CM | POA: Diagnosis not present

## 2023-12-09 DIAGNOSIS — I50810 Right heart failure, unspecified: Secondary | ICD-10-CM | POA: Diagnosis not present

## 2023-12-12 DIAGNOSIS — N2889 Other specified disorders of kidney and ureter: Secondary | ICD-10-CM | POA: Diagnosis not present

## 2023-12-15 DIAGNOSIS — I361 Nonrheumatic tricuspid (valve) insufficiency: Secondary | ICD-10-CM | POA: Diagnosis not present

## 2023-12-15 DIAGNOSIS — I340 Nonrheumatic mitral (valve) insufficiency: Secondary | ICD-10-CM | POA: Diagnosis not present

## 2023-12-15 DIAGNOSIS — I351 Nonrheumatic aortic (valve) insufficiency: Secondary | ICD-10-CM | POA: Diagnosis not present

## 2023-12-15 DIAGNOSIS — I50810 Right heart failure, unspecified: Secondary | ICD-10-CM | POA: Diagnosis not present

## 2023-12-26 DIAGNOSIS — J449 Chronic obstructive pulmonary disease, unspecified: Secondary | ICD-10-CM | POA: Diagnosis not present

## 2023-12-26 DIAGNOSIS — E785 Hyperlipidemia, unspecified: Secondary | ICD-10-CM | POA: Diagnosis not present

## 2023-12-26 DIAGNOSIS — I480 Paroxysmal atrial fibrillation: Secondary | ICD-10-CM | POA: Diagnosis not present

## 2023-12-26 DIAGNOSIS — F419 Anxiety disorder, unspecified: Secondary | ICD-10-CM | POA: Diagnosis not present

## 2023-12-26 DIAGNOSIS — I872 Venous insufficiency (chronic) (peripheral): Secondary | ICD-10-CM | POA: Diagnosis not present

## 2023-12-26 DIAGNOSIS — Z720 Tobacco use: Secondary | ICD-10-CM | POA: Diagnosis not present

## 2023-12-26 DIAGNOSIS — Z8673 Personal history of transient ischemic attack (TIA), and cerebral infarction without residual deficits: Secondary | ICD-10-CM | POA: Diagnosis not present

## 2023-12-26 DIAGNOSIS — M8589 Other specified disorders of bone density and structure, multiple sites: Secondary | ICD-10-CM | POA: Diagnosis not present

## 2023-12-26 DIAGNOSIS — R490 Dysphonia: Secondary | ICD-10-CM | POA: Diagnosis not present

## 2023-12-26 DIAGNOSIS — I10 Essential (primary) hypertension: Secondary | ICD-10-CM | POA: Diagnosis not present

## 2023-12-30 DIAGNOSIS — M8589 Other specified disorders of bone density and structure, multiple sites: Secondary | ICD-10-CM | POA: Diagnosis not present

## 2023-12-31 DIAGNOSIS — M810 Age-related osteoporosis without current pathological fracture: Secondary | ICD-10-CM | POA: Diagnosis not present

## 2023-12-31 DIAGNOSIS — Z13820 Encounter for screening for osteoporosis: Secondary | ICD-10-CM | POA: Diagnosis not present

## 2024-01-03 DIAGNOSIS — I509 Heart failure, unspecified: Secondary | ICD-10-CM | POA: Diagnosis not present

## 2024-01-03 DIAGNOSIS — I110 Hypertensive heart disease with heart failure: Secondary | ICD-10-CM | POA: Diagnosis not present

## 2024-01-03 DIAGNOSIS — I872 Venous insufficiency (chronic) (peripheral): Secondary | ICD-10-CM | POA: Diagnosis not present

## 2024-01-03 DIAGNOSIS — I4891 Unspecified atrial fibrillation: Secondary | ICD-10-CM | POA: Diagnosis not present

## 2024-01-09 ENCOUNTER — Encounter: Payer: Self-pay | Admitting: Cardiology

## 2024-01-09 ENCOUNTER — Ambulatory Visit: Payer: Self-pay | Attending: Cardiology | Admitting: Cardiology

## 2024-01-09 ENCOUNTER — Other Ambulatory Visit: Payer: Self-pay

## 2024-01-09 ENCOUNTER — Ambulatory Visit

## 2024-01-09 VITALS — BP 142/90 | HR 117 | Ht 68.0 in | Wt 108.8 lb

## 2024-01-09 DIAGNOSIS — I4891 Unspecified atrial fibrillation: Secondary | ICD-10-CM | POA: Diagnosis not present

## 2024-01-09 DIAGNOSIS — Z720 Tobacco use: Secondary | ICD-10-CM

## 2024-01-09 DIAGNOSIS — E782 Mixed hyperlipidemia: Secondary | ICD-10-CM

## 2024-01-09 DIAGNOSIS — I4892 Unspecified atrial flutter: Secondary | ICD-10-CM

## 2024-01-09 MED ORDER — CARVEDILOL 12.5 MG PO TABS: 18.7500 mg | ORAL_TABLET | Freq: Two times a day (BID) | ORAL | 3 refills | Status: AC

## 2024-01-09 NOTE — Progress Notes (Signed)
 Cardiology Office Note:    Date:  01/09/2024   ID:  Donna Waters, DOB 1956/08/07, MRN [id number]  PCP:  Paulina Fusi, MD  Cardiologist:  Garwin Brothers, MD   Referring MD: Paulina Fusi, MD    ASSESSMENT:    1. Mixed dyslipidemia   2. Atrial fibrillation, unspecified type (HCC)   3. Atrial flutter with rapid ventricular response (HCC)   4. Tobacco abuse    PLAN:    In order of problems listed above:  Primary prevention stressed with the patient.  Importance of compliance with diet medication stressed and patient verbalized standing. Atrial fibrillation:I discussed with the patient atrial fibrillation, disease process. Management and therapy including rate and rhythm control, anticoagulation benefits and potential risks were discussed extensively with the patient. Patient had multiple questions which were answered to patient's satisfaction.  I have increased her carvedilol and she will be monitored for 2 weeks with ZIO monitor to understand her heart rates.  Her pain is contributing to her symptoms.  She has orthopedic back issues. Cigarette smoker: I spent 5 minutes with the patient discussing solely about smoking. Smoking cessation was counseled. I suggested to the patient also different medications and pharmacological interventions. Patient is keen to try stopping on its own at this time. He will get back to me if he needs any further assistance in this matter. Essential hypertension: Blood pressure is stable and diet was emphasized. Patient will be seen in follow-up appointment in 6 months or earlier if the patient has any concerns.    Medication Adjustments/Labs and Tests Ordered: Current medicines are reviewed at length with the patient today.  Concerns regarding medicines are outlined above.  Orders Placed This Encounter  Procedures   LONG TERM MONITOR (3-14 DAYS)   EKG 12-Lead   Meds ordered this encounter  Medications   carvedilol (COREG) 12.5 MG tablet     Sig: Take 1.5 tablets (18.75 mg total) by mouth 2 (two) times daily.    Dispense:  270 tablet    Refill:  3     No chief complaint on file.    History of Present Illness:    Donna Waters is a 68 y.o. female.  Patient has past medical history of mixed dyslipidemia, atrial fibrillation and flutter and COPD.  Unfortunately she continues to smoke.  She denies any chest pain orthopnea or PND.  She leads a very sedentary lifestyle.  At the time of my evaluation, the patient is alert awake oriented and in no distress.  Past Medical History:  Diagnosis Date   Abnormal EKG 01/15/2018   Acute ischemic stroke [facility])    Anxiety    Atrial fibrillation (HCC)    Atrial flutter with rapid ventricular response (HCC)    Cerebellar infarct (HCC)    Cervical radiculopathy    Chronic GERD    COPD, moderate (HCC)    COVID-19    Current smoker 01/15/2018   Essential hypertension 01/15/2018   History of cervical cancer    Hypertension    Hypertension, essential, benign    Hypertensive urgency    Ischemia 03/01/2018   Mixed dyslipidemia 08/06/2022   Nicotine dependence, unspecified, uncomplicated    Scoliosis of lumbar region due to degenerative disease of spine in adult    Severe protein-calorie malnutrition (HCC)    Tobacco abuse    Unspecified osteoarthritis, unspecified site    Vertigo    Vertigo of central origin     Past Surgical History:  Procedure Laterality Date  LEFT HEART CATH AND CORONARY ANGIOGRAPHY N/A 03/07/2018   Procedure: LEFT HEART CATH AND CORONARY ANGIOGRAPHY;  Surgeon: Tonny Bollman, MD;  Location: [facility] INVASIVE CV LAB;  Service: Cardiovascular;  Laterality: N/A;   TOTAL VAGINAL HYSTERECTOMY  10/19/2018   TUBAL LIGATION      Current Medications: Current Meds  Medication Sig   albuterol (VENTOLIN HFA) 108 (90 Base) MCG/ACT inhaler Inhale 2 puffs into the lungs 4 (four) times daily.   alendronate (FOSAMAX) 70 MG tablet Take 70 mg by mouth once a week.   apixaban  (ELIQUIS) 5 MG TABS tablet Take 1 tablet by mouth twice daily   atorvastatin (LIPITOR) 40 MG tablet Take 40 mg by mouth at bedtime.   Budeson-Glycopyrrol-Formoterol 160-9-4.8 MCG/ACT AERO Inhale 2 puffs into the lungs 2 (two) times daily.   FLUoxetine (PROZAC) 20 MG capsule Take 20 mg by mouth daily.   furosemide (LASIX) 20 MG tablet Take 20 mg by mouth daily.   ondansetron (ZOFRAN-ODT) 4 MG disintegrating tablet Take 4 mg by mouth every 6 (six) hours as needed for nausea/vomiting.   [DISCONTINUED] carvedilol (COREG) 12.5 MG tablet Take 12.5 mg by mouth 2 (two) times daily.     Allergies:   Lisinopril   Social History   Socioeconomic History   Marital status: Unknown    Spouse name: Not on file   Number of children: Not on file   Years of education: Not on file   Highest education level: Not on file  Occupational History   Not on file  Tobacco Use   Smoking status: Every Day    Current packs/day: 1.00    Types: Cigarettes   Smokeless tobacco: Never  Vaping Use   Vaping status: Never Used  Substance and Sexual Activity   Alcohol use: Yes    Comment: 6 pack weekly   Drug use: Not Currently   Sexual activity: Not on file  Other Topics Concern   Not on file  Social History Narrative   Not on file   Social Drivers of Health   Financial Resource Strain: Not on file  Food Insecurity: Not on file  Transportation Needs: Not on file  Physical Activity: Not on file  Stress: Not on file  Social Connections: Not on file     Family History: The patient's family history includes Arthritis in her father and mother; COPD in her mother; Depression in her mother; Diabetes in her mother; Heart disease in her mother; Hyperlipidemia in her mother; Hypertension in her father and mother; Stroke in her father and sister; Thyroid disease in her mother.  ROS:   Please see the history of present illness.    All other systems reviewed and are negative.  EKGs/Labs/Other Studies Reviewed:     The following studies were reviewed today: .Marland KitchenEKG Interpretation Date/Time:  Monday January 09 2024 16:08:43 EDT Ventricular Rate:  117 PR Interval:    QRS Duration:  88 QT Interval:  310 QTC Calculation: 432 R Axis:   100  Text Interpretation: Atrial fibrillation with rapid ventricular response Rightward axis Anterior infarct , age undetermined Abnormal ECG No previous ECGs available Confirmed by Belva Crome [phone]) on 01/09/2024 4:52:14 PM     Recent Labs: No results found for requested labs within last 365 days.  Recent Lipid Panel    Component Value Date/Time   CHOL 142 03/17/2018 0928   TRIG 47 03/17/2018 0928   HDL 79 03/17/2018 0928   CHOLHDL 1.8 03/17/2018 0928   LDLCALC 54 03/17/2018 9528  Physical Exam:    VS:  BP (!) 142/90   Pulse (!) 117   Ht 5\' 8"  (1.727 m)   Wt 108 lb 12.8 oz (49.4 kg)   SpO2 99%   BMI 16.54 kg/m     Wt Readings from Last 3 Encounters:  01/09/24 108 lb 12.8 oz (49.4 kg)  09/10/22 118 lb 6.4 oz (53.7 kg)  08/06/22 114 lb 3.2 oz (51.8 kg)     GEN: Patient is in no acute distress HEENT: Normal NECK: No JVD; No carotid bruits LYMPHATICS: No lymphadenopathy CARDIAC: Hear sounds regular, 2/6 systolic murmur at the apex. RESPIRATORY:  Clear to auscultation without rales, wheezing or rhonchi  ABDOMEN: Soft, non-tender, non-distended MUSCULOSKELETAL:  No edema; No deformity  SKIN: Warm and dry NEUROLOGIC:  Alert and oriented x 3 PSYCHIATRIC:  Normal affect   Signed, Garwin Brothers, MD  01/09/2024 4:53 PM    [facility]

## 2024-01-09 NOTE — Patient Instructions (Addendum)
 Medication Instructions:  Your physician has recommended you make the following change in your medication:   Increase your carvedilol to 18.75 (1 1/2 tablet) twice daily  *If you need a refill on your cardiac medications before your next appointment, please call your pharmacy*   Lab Work: None ordered If you have labs (blood work) drawn today and your tests are completely normal, you will receive your results only by: MyChart Message (if you have MyChart) OR A paper copy in the mail If you have any lab test that is abnormal or we need to change your treatment, we will call you to review the results.   Testing/Procedures: A zio monitor was ordered today. It will remain on for 14 days. Remove 01/23/24. You will then return monitor and event diary in provided box. It takes 1-2 weeks for report to be downloaded and returned to [place]. We will call you with the results. If monitor falls off or has orange flashing light, please call [employer] for further instructions.    Follow-Up: At [facility], you and your health needs are our priority.  As part of our continuing mission to provide you with exceptional heart care, we have created designated Provider Care Teams.  These Care Teams include your primary Cardiologist (physician) and Advanced Practice Providers (APPs -  Physician Assistants and Nurse Practitioners) who all work together to provide you with the care you need, when you need it.  We recommend signing up for the patient portal called "MyChart".  Sign up information is provided on this After Visit Summary.  MyChart is used to connect with patients for Virtual Visits (Telemedicine).  Patients are able to view lab/test results, encounter notes, upcoming appointments, etc.  Non-urgent messages can be sent to your provider as well.   To learn more about what you can do with MyChart, go to [email or web address].    Your next appointment:   9 month(s)  The format for your next  appointment:   In Person  Provider:   Belva Crome, MD    Other Instructions none  Important Information About Sugar

## 2024-01-10 DIAGNOSIS — I5023 Acute on chronic systolic (congestive) heart failure: Secondary | ICD-10-CM | POA: Diagnosis not present

## 2024-01-10 DIAGNOSIS — I878 Other specified disorders of veins: Secondary | ICD-10-CM | POA: Diagnosis not present

## 2024-01-10 DIAGNOSIS — S81801A Unspecified open wound, right lower leg, initial encounter: Secondary | ICD-10-CM | POA: Diagnosis not present

## 2024-01-10 DIAGNOSIS — I480 Paroxysmal atrial fibrillation: Secondary | ICD-10-CM | POA: Diagnosis not present

## 2024-01-10 DIAGNOSIS — S81811A Laceration without foreign body, right lower leg, initial encounter: Secondary | ICD-10-CM | POA: Diagnosis not present

## 2024-01-18 DIAGNOSIS — I872 Venous insufficiency (chronic) (peripheral): Secondary | ICD-10-CM | POA: Diagnosis not present

## 2024-01-18 DIAGNOSIS — I10 Essential (primary) hypertension: Secondary | ICD-10-CM | POA: Diagnosis not present

## 2024-01-18 DIAGNOSIS — I739 Peripheral vascular disease, unspecified: Secondary | ICD-10-CM | POA: Diagnosis not present

## 2024-01-18 DIAGNOSIS — R209 Unspecified disturbances of skin sensation: Secondary | ICD-10-CM | POA: Diagnosis not present

## 2024-01-18 DIAGNOSIS — I75023 Atheroembolism of bilateral lower extremities: Secondary | ICD-10-CM | POA: Diagnosis not present

## 2024-01-18 DIAGNOSIS — I4819 Other persistent atrial fibrillation: Secondary | ICD-10-CM | POA: Diagnosis not present

## 2024-01-18 DIAGNOSIS — Z720 Tobacco use: Secondary | ICD-10-CM | POA: Diagnosis not present

## 2024-01-19 DIAGNOSIS — M5412 Radiculopathy, cervical region: Secondary | ICD-10-CM | POA: Diagnosis not present

## 2024-01-24 DIAGNOSIS — I878 Other specified disorders of veins: Secondary | ICD-10-CM | POA: Diagnosis not present

## 2024-01-24 DIAGNOSIS — R001 Bradycardia, unspecified: Secondary | ICD-10-CM | POA: Diagnosis not present

## 2024-01-24 DIAGNOSIS — T50905A Adverse effect of unspecified drugs, medicaments and biological substances, initial encounter: Secondary | ICD-10-CM | POA: Diagnosis not present

## 2024-01-24 DIAGNOSIS — I739 Peripheral vascular disease, unspecified: Secondary | ICD-10-CM | POA: Diagnosis not present

## 2024-01-24 DIAGNOSIS — I5023 Acute on chronic systolic (congestive) heart failure: Secondary | ICD-10-CM | POA: Diagnosis not present

## 2024-01-24 DIAGNOSIS — Z720 Tobacco use: Secondary | ICD-10-CM | POA: Diagnosis not present

## 2024-01-24 DIAGNOSIS — I480 Paroxysmal atrial fibrillation: Secondary | ICD-10-CM | POA: Diagnosis not present

## 2024-01-27 DIAGNOSIS — I739 Peripheral vascular disease, unspecified: Secondary | ICD-10-CM | POA: Diagnosis not present

## 2024-02-01 DIAGNOSIS — I4892 Unspecified atrial flutter: Secondary | ICD-10-CM | POA: Diagnosis not present

## 2024-02-01 DIAGNOSIS — I4891 Unspecified atrial fibrillation: Secondary | ICD-10-CM | POA: Diagnosis not present

## 2024-02-03 DIAGNOSIS — I4891 Unspecified atrial fibrillation: Secondary | ICD-10-CM

## 2024-02-03 DIAGNOSIS — I4892 Unspecified atrial flutter: Secondary | ICD-10-CM | POA: Diagnosis not present

## 2024-02-06 DIAGNOSIS — M4802 Spinal stenosis, cervical region: Secondary | ICD-10-CM | POA: Diagnosis not present

## 2024-02-06 DIAGNOSIS — M5412 Radiculopathy, cervical region: Secondary | ICD-10-CM | POA: Diagnosis not present

## 2024-02-07 ENCOUNTER — Telehealth: Payer: Self-pay

## 2024-02-07 DIAGNOSIS — M5412 Radiculopathy, cervical region: Secondary | ICD-10-CM | POA: Diagnosis not present

## 2024-02-07 NOTE — Telephone Encounter (Signed)
 Left vm to return call  Monitor showed Atrial fibrillation with well-controlled ventricular rate. No changes.

## 2024-02-07 NOTE — Telephone Encounter (Signed)
-----   Message from Aundra Dubin Revankar sent at 02/03/2024  4:46 PM EDT ----- The results of the study is unremarkable. Please inform patient. I will discuss in detail at next appointment. Cc  primary care/referring physician Garwin Brothers, MD 02/03/2024 4:46 PM

## 2024-03-08 DIAGNOSIS — M4802 Spinal stenosis, cervical region: Secondary | ICD-10-CM | POA: Diagnosis not present

## 2024-03-08 DIAGNOSIS — M5090 Cervical disc disorder, unspecified, unspecified cervical region: Secondary | ICD-10-CM | POA: Diagnosis not present

## 2024-03-08 DIAGNOSIS — M5412 Radiculopathy, cervical region: Secondary | ICD-10-CM | POA: Diagnosis not present

## 2024-03-08 DIAGNOSIS — G894 Chronic pain syndrome: Secondary | ICD-10-CM | POA: Diagnosis not present

## 2024-03-08 DIAGNOSIS — Z79891 Long term (current) use of opiate analgesic: Secondary | ICD-10-CM | POA: Diagnosis not present

## 2024-03-12 ENCOUNTER — Other Ambulatory Visit: Payer: Self-pay | Admitting: Cardiology

## 2024-03-12 NOTE — Telephone Encounter (Signed)
 Prescription refill request for Eliquis  received. Indication:afib Last office visit:3/25 Scr:0.84  3/25 Age: 68 Weight:49.4  kg  Prescription refilled

## 2024-03-27 DIAGNOSIS — Z720 Tobacco use: Secondary | ICD-10-CM | POA: Diagnosis not present

## 2024-03-27 DIAGNOSIS — J449 Chronic obstructive pulmonary disease, unspecified: Secondary | ICD-10-CM | POA: Diagnosis not present

## 2024-03-27 DIAGNOSIS — F419 Anxiety disorder, unspecified: Secondary | ICD-10-CM | POA: Diagnosis not present

## 2024-03-27 DIAGNOSIS — Z0000 Encounter for general adult medical examination without abnormal findings: Secondary | ICD-10-CM | POA: Diagnosis not present

## 2024-03-27 DIAGNOSIS — Z8673 Personal history of transient ischemic attack (TIA), and cerebral infarction without residual deficits: Secondary | ICD-10-CM | POA: Diagnosis not present

## 2024-03-27 DIAGNOSIS — M5412 Radiculopathy, cervical region: Secondary | ICD-10-CM | POA: Diagnosis not present

## 2024-03-27 DIAGNOSIS — I480 Paroxysmal atrial fibrillation: Secondary | ICD-10-CM | POA: Diagnosis not present

## 2024-03-27 DIAGNOSIS — I872 Venous insufficiency (chronic) (peripheral): Secondary | ICD-10-CM | POA: Diagnosis not present

## 2024-03-27 DIAGNOSIS — I10 Essential (primary) hypertension: Secondary | ICD-10-CM | POA: Diagnosis not present

## 2024-03-27 DIAGNOSIS — Z9181 History of falling: Secondary | ICD-10-CM | POA: Diagnosis not present

## 2024-03-27 DIAGNOSIS — E785 Hyperlipidemia, unspecified: Secondary | ICD-10-CM | POA: Diagnosis not present

## 2024-04-03 DIAGNOSIS — M5090 Cervical disc disorder, unspecified, unspecified cervical region: Secondary | ICD-10-CM | POA: Diagnosis not present

## 2024-04-03 DIAGNOSIS — M4802 Spinal stenosis, cervical region: Secondary | ICD-10-CM | POA: Diagnosis not present

## 2024-04-03 DIAGNOSIS — G894 Chronic pain syndrome: Secondary | ICD-10-CM | POA: Diagnosis not present

## 2024-04-03 DIAGNOSIS — M5412 Radiculopathy, cervical region: Secondary | ICD-10-CM | POA: Diagnosis not present

## 2024-05-03 DIAGNOSIS — M4802 Spinal stenosis, cervical region: Secondary | ICD-10-CM | POA: Diagnosis not present

## 2024-05-03 DIAGNOSIS — Z79891 Long term (current) use of opiate analgesic: Secondary | ICD-10-CM | POA: Diagnosis not present

## 2024-05-03 DIAGNOSIS — M5412 Radiculopathy, cervical region: Secondary | ICD-10-CM | POA: Diagnosis not present

## 2024-05-03 DIAGNOSIS — G894 Chronic pain syndrome: Secondary | ICD-10-CM | POA: Diagnosis not present

## 2024-05-03 DIAGNOSIS — M5090 Cervical disc disorder, unspecified, unspecified cervical region: Secondary | ICD-10-CM | POA: Diagnosis not present

## 2024-06-05 DIAGNOSIS — M5412 Radiculopathy, cervical region: Secondary | ICD-10-CM | POA: Diagnosis not present

## 2024-06-05 DIAGNOSIS — Z79891 Long term (current) use of opiate analgesic: Secondary | ICD-10-CM | POA: Diagnosis not present

## 2024-06-05 DIAGNOSIS — M4802 Spinal stenosis, cervical region: Secondary | ICD-10-CM | POA: Diagnosis not present

## 2024-06-05 DIAGNOSIS — M5090 Cervical disc disorder, unspecified, unspecified cervical region: Secondary | ICD-10-CM | POA: Diagnosis not present

## 2024-07-04 DIAGNOSIS — E785 Hyperlipidemia, unspecified: Secondary | ICD-10-CM | POA: Diagnosis not present

## 2024-07-04 DIAGNOSIS — F419 Anxiety disorder, unspecified: Secondary | ICD-10-CM | POA: Diagnosis not present

## 2024-07-04 DIAGNOSIS — I10 Essential (primary) hypertension: Secondary | ICD-10-CM | POA: Diagnosis not present

## 2024-07-04 DIAGNOSIS — I480 Paroxysmal atrial fibrillation: Secondary | ICD-10-CM | POA: Diagnosis not present

## 2024-07-04 DIAGNOSIS — Z8673 Personal history of transient ischemic attack (TIA), and cerebral infarction without residual deficits: Secondary | ICD-10-CM | POA: Diagnosis not present

## 2024-07-04 DIAGNOSIS — I872 Venous insufficiency (chronic) (peripheral): Secondary | ICD-10-CM | POA: Diagnosis not present

## 2024-07-04 DIAGNOSIS — J449 Chronic obstructive pulmonary disease, unspecified: Secondary | ICD-10-CM | POA: Diagnosis not present

## 2024-07-09 DIAGNOSIS — M4802 Spinal stenosis, cervical region: Secondary | ICD-10-CM | POA: Diagnosis not present

## 2024-07-09 DIAGNOSIS — M5412 Radiculopathy, cervical region: Secondary | ICD-10-CM | POA: Diagnosis not present

## 2024-07-09 DIAGNOSIS — Z79891 Long term (current) use of opiate analgesic: Secondary | ICD-10-CM | POA: Diagnosis not present

## 2024-07-23 DIAGNOSIS — J209 Acute bronchitis, unspecified: Secondary | ICD-10-CM | POA: Diagnosis not present

## 2024-07-23 DIAGNOSIS — J440 Chronic obstructive pulmonary disease with acute lower respiratory infection: Secondary | ICD-10-CM | POA: Diagnosis not present

## 2024-08-06 DIAGNOSIS — Z79891 Long term (current) use of opiate analgesic: Secondary | ICD-10-CM | POA: Diagnosis not present

## 2024-08-06 DIAGNOSIS — M4802 Spinal stenosis, cervical region: Secondary | ICD-10-CM | POA: Diagnosis not present

## 2024-08-06 DIAGNOSIS — M5412 Radiculopathy, cervical region: Secondary | ICD-10-CM | POA: Diagnosis not present

## 2024-08-24 DIAGNOSIS — D1724 Benign lipomatous neoplasm of skin and subcutaneous tissue of left leg: Secondary | ICD-10-CM | POA: Diagnosis not present

## 2024-09-11 ENCOUNTER — Other Ambulatory Visit: Payer: Self-pay | Admitting: Cardiology

## 2024-09-11 NOTE — Telephone Encounter (Signed)
 Prescription refill request for Eliquis  received. Indication:afib Last office visit:3/25 Scr:0.84  3/25 Age: 68 Weight:49.4  kg  Prescription refilled

## 2024-09-26 DIAGNOSIS — L720 Epidermal cyst: Secondary | ICD-10-CM | POA: Diagnosis not present

## 2024-09-26 DIAGNOSIS — L0100 Impetigo, unspecified: Secondary | ICD-10-CM | POA: Diagnosis not present

## 2024-10-08 DIAGNOSIS — I872 Venous insufficiency (chronic) (peripheral): Secondary | ICD-10-CM | POA: Diagnosis not present

## 2024-10-08 DIAGNOSIS — Z23 Encounter for immunization: Secondary | ICD-10-CM | POA: Diagnosis not present

## 2024-10-08 DIAGNOSIS — J449 Chronic obstructive pulmonary disease, unspecified: Secondary | ICD-10-CM | POA: Diagnosis not present

## 2024-10-08 DIAGNOSIS — I480 Paroxysmal atrial fibrillation: Secondary | ICD-10-CM | POA: Diagnosis not present

## 2024-10-08 DIAGNOSIS — Z8673 Personal history of transient ischemic attack (TIA), and cerebral infarction without residual deficits: Secondary | ICD-10-CM | POA: Diagnosis not present

## 2024-10-08 DIAGNOSIS — E785 Hyperlipidemia, unspecified: Secondary | ICD-10-CM | POA: Diagnosis not present

## 2024-10-08 DIAGNOSIS — I10 Essential (primary) hypertension: Secondary | ICD-10-CM | POA: Diagnosis not present

## 2024-10-08 DIAGNOSIS — F419 Anxiety disorder, unspecified: Secondary | ICD-10-CM | POA: Diagnosis not present
